# Patient Record
Sex: Male | Born: 1950
Health system: Southern US, Community
[De-identification: ages and names within clinical notes are randomized; demographics above are authoritative.]

## PROBLEM LIST (undated history)

## (undated) DIAGNOSIS — I10 Essential (primary) hypertension: Secondary | ICD-10-CM

## (undated) DIAGNOSIS — R972 Elevated prostate specific antigen [PSA]: Secondary | ICD-10-CM

## (undated) DIAGNOSIS — E119 Type 2 diabetes mellitus without complications: Secondary | ICD-10-CM

## (undated) DIAGNOSIS — M109 Gout, unspecified: Secondary | ICD-10-CM

## (undated) DIAGNOSIS — K219 Gastro-esophageal reflux disease without esophagitis: Secondary | ICD-10-CM

## (undated) DIAGNOSIS — R9431 Abnormal electrocardiogram [ECG] [EKG]: Secondary | ICD-10-CM

## (undated) DIAGNOSIS — Z87442 Personal history of urinary calculi: Secondary | ICD-10-CM

## (undated) DIAGNOSIS — C61 Malignant neoplasm of prostate: Secondary | ICD-10-CM

## (undated) DIAGNOSIS — N189 Chronic kidney disease, unspecified: Secondary | ICD-10-CM

## (undated) DIAGNOSIS — E7800 Pure hypercholesterolemia, unspecified: Secondary | ICD-10-CM

## (undated) DIAGNOSIS — H3530 Unspecified macular degeneration: Secondary | ICD-10-CM

## (undated) DIAGNOSIS — G4730 Sleep apnea, unspecified: Secondary | ICD-10-CM

## (undated) HISTORY — PX: KIDNEY STONE SURGERY: SHX686

## (undated) HISTORY — PX: PROSTATE BIOPSY: SHX241

## (undated) HISTORY — PX: NASAL SINUS SURGERY: SHX719

## (undated) HISTORY — DX: Chronic kidney disease, unspecified: N18.9

## (undated) HISTORY — DX: Pure hypercholesterolemia, unspecified: E78.00

## (undated) HISTORY — DX: Elevated prostate specific antigen (PSA): R97.20

## (undated) HISTORY — DX: Gout, unspecified: M10.9

## (undated) HISTORY — DX: Unspecified macular degeneration: H35.30

---

## 1998-05-19 ENCOUNTER — Emergency Department (HOSPITAL_COMMUNITY): Admission: EM | Admit: 1998-05-19 | Discharge: 1998-05-19 | Payer: Self-pay | Admitting: Emergency Medicine

## 1999-06-02 ENCOUNTER — Encounter: Payer: Self-pay | Admitting: Family Medicine

## 1999-06-02 ENCOUNTER — Encounter: Admission: RE | Admit: 1999-06-02 | Discharge: 1999-06-02 | Payer: Self-pay | Admitting: Family Medicine

## 2000-03-23 ENCOUNTER — Emergency Department (HOSPITAL_COMMUNITY): Admission: EM | Admit: 2000-03-23 | Discharge: 2000-03-23 | Payer: Self-pay | Admitting: Emergency Medicine

## 2000-03-24 ENCOUNTER — Encounter: Payer: Self-pay | Admitting: Emergency Medicine

## 2003-07-21 ENCOUNTER — Emergency Department (HOSPITAL_COMMUNITY): Admission: EM | Admit: 2003-07-21 | Discharge: 2003-07-21 | Payer: Self-pay | Admitting: Emergency Medicine

## 2004-11-06 ENCOUNTER — Emergency Department (HOSPITAL_COMMUNITY): Admission: EM | Admit: 2004-11-06 | Discharge: 2004-11-06 | Payer: Self-pay | Admitting: Emergency Medicine

## 2006-02-07 ENCOUNTER — Encounter (INDEPENDENT_AMBULATORY_CARE_PROVIDER_SITE_OTHER): Payer: Self-pay | Admitting: *Deleted

## 2006-02-07 ENCOUNTER — Ambulatory Visit (HOSPITAL_COMMUNITY): Admission: RE | Admit: 2006-02-07 | Discharge: 2006-02-07 | Payer: Self-pay | Admitting: *Deleted

## 2006-05-07 ENCOUNTER — Encounter (INDEPENDENT_AMBULATORY_CARE_PROVIDER_SITE_OTHER): Payer: Self-pay | Admitting: *Deleted

## 2006-05-07 ENCOUNTER — Inpatient Hospital Stay (HOSPITAL_COMMUNITY): Admission: RE | Admit: 2006-05-07 | Discharge: 2006-05-15 | Payer: Self-pay | Admitting: General Surgery

## 2006-05-07 HISTORY — PX: RIGHT COLECTOMY: SHX853

## 2006-08-12 ENCOUNTER — Emergency Department (HOSPITAL_COMMUNITY): Admission: EM | Admit: 2006-08-12 | Discharge: 2006-08-12 | Payer: Self-pay | Admitting: Emergency Medicine

## 2007-11-24 ENCOUNTER — Encounter: Admission: RE | Admit: 2007-11-24 | Discharge: 2007-12-08 | Payer: Self-pay | Admitting: Endocrinology

## 2007-12-11 ENCOUNTER — Encounter: Admission: RE | Admit: 2007-12-11 | Discharge: 2007-12-18 | Payer: Self-pay | Admitting: Endocrinology

## 2008-02-29 ENCOUNTER — Inpatient Hospital Stay (HOSPITAL_COMMUNITY): Admission: EM | Admit: 2008-02-29 | Discharge: 2008-03-02 | Payer: Self-pay | Admitting: Emergency Medicine

## 2008-03-01 ENCOUNTER — Encounter (INDEPENDENT_AMBULATORY_CARE_PROVIDER_SITE_OTHER): Payer: Self-pay | Admitting: Internal Medicine

## 2008-03-01 ENCOUNTER — Ambulatory Visit: Payer: Self-pay | Admitting: Vascular Surgery

## 2010-05-22 LAB — URINALYSIS, ROUTINE W REFLEX MICROSCOPIC
Bilirubin Urine: NEGATIVE
Hgb urine dipstick: NEGATIVE
Ketones, ur: NEGATIVE mg/dL
Leukocytes, UA: NEGATIVE
Nitrite: NEGATIVE
Protein, ur: 100 mg/dL — AB
Specific Gravity, Urine: 1.024 (ref 1.005–1.030)
pH: 7 (ref 5.0–8.0)

## 2010-05-22 LAB — FOLATE RBC: RBC Folate: 883 ng/mL — ABNORMAL HIGH (ref 180–600)

## 2010-05-22 LAB — GLUCOSE, CAPILLARY
Glucose-Capillary: 105 mg/dL — ABNORMAL HIGH (ref 70–99)
Glucose-Capillary: 121 mg/dL — ABNORMAL HIGH (ref 70–99)
Glucose-Capillary: 129 mg/dL — ABNORMAL HIGH (ref 70–99)
Glucose-Capillary: 163 mg/dL — ABNORMAL HIGH (ref 70–99)

## 2010-05-22 LAB — CBC
HCT: 40.2 % (ref 39.0–52.0)
HCT: 40.8 % (ref 39.0–52.0)
Hemoglobin: 13.4 g/dL (ref 13.0–17.0)
Hemoglobin: 13.4 g/dL (ref 13.0–17.0)
MCHC: 32.9 g/dL (ref 30.0–36.0)
MCV: 80.2 fL (ref 78.0–100.0)
MCV: 80.6 fL (ref 78.0–100.0)
RBC: 5.09 MIL/uL (ref 4.22–5.81)
RDW: 14.6 % (ref 11.5–15.5)
WBC: 11.1 10*3/uL — ABNORMAL HIGH (ref 4.0–10.5)

## 2010-05-22 LAB — DIFFERENTIAL
Eosinophils Absolute: 0.1 10*3/uL (ref 0.0–0.7)
Lymphs Abs: 2.1 10*3/uL (ref 0.7–4.0)
Monocytes Relative: 5 % (ref 3–12)
Neutro Abs: 8.5 10*3/uL — ABNORMAL HIGH (ref 1.7–7.7)
Neutrophils Relative %: 75 % (ref 43–77)

## 2010-05-22 LAB — URINE MICROSCOPIC-ADD ON

## 2010-05-22 LAB — RPR
RPR Ser Ql: NONREACTIVE
RPR Ser Ql: NONREACTIVE

## 2010-05-22 LAB — COMPREHENSIVE METABOLIC PANEL
BUN: 15 mg/dL (ref 6–23)
CO2: 27 mEq/L (ref 19–32)
Calcium: 9.4 mg/dL (ref 8.4–10.5)
Creatinine, Ser: 1.03 mg/dL (ref 0.4–1.5)
GFR calc non Af Amer: >60 mL/min (ref >60)
Glucose, Bld: 116 mg/dL — ABNORMAL HIGH (ref 70–99)
Sodium: 141 mEq/L (ref 135–145)
Total Protein: 7.4 g/dL (ref 6.0–8.3)

## 2010-05-22 LAB — TSH: TSH: 2.369 u[IU]/mL (ref 0.350–4.500)

## 2010-05-22 LAB — POCT CARDIAC MARKERS
CKMB, poc: <1 ng/mL — ABNORMAL LOW (ref 1.0–8.0)
Myoglobin, poc: 121 ng/mL (ref 12–200)

## 2010-05-22 LAB — BASIC METABOLIC PANEL
BUN: 14 mg/dL (ref 6–23)
Chloride: 106 mEq/L (ref 96–112)
Creatinine, Ser: 0.96 mg/dL (ref 0.4–1.5)
Glucose, Bld: 113 mg/dL — ABNORMAL HIGH (ref 70–99)
Potassium: 3.6 mEq/L (ref 3.5–5.1)

## 2010-05-22 LAB — PROTIME-INR
INR: 1.1 (ref 0.00–1.49)
Prothrombin Time: 14.1 seconds (ref 11.6–15.2)

## 2010-05-22 LAB — APTT: aPTT: 30 seconds (ref 24–37)

## 2010-05-22 LAB — LIPID PANEL
HDL: 22 mg/dL — ABNORMAL LOW (ref >39)
LDL Cholesterol: 107 mg/dL — ABNORMAL HIGH (ref 0–99)
Triglycerides: 176 mg/dL — ABNORMAL HIGH (ref <150)
VLDL: 35 mg/dL (ref 0–40)

## 2010-05-22 LAB — VITAMIN B12: Vitamin B-12: 250 pg/mL (ref 211–911)

## 2010-05-22 LAB — HIV ANTIBODY (ROUTINE TESTING W REFLEX): HIV: NONREACTIVE

## 2010-05-22 LAB — CARDIAC PANEL(CRET KIN+CKTOT+MB+TROPI)
Relative Index: 1 (ref 0.0–2.5)
Total CK: 212 U/L (ref 7–232)

## 2010-05-22 LAB — TROPONIN I: Troponin I: <0.01 ng/mL (ref 0.00–0.06)

## 2010-05-22 LAB — CK TOTAL AND CKMB (NOT AT ARMC): Total CK: 177 U/L (ref 7–232)

## 2010-06-20 NOTE — Discharge Summary (Signed)
NAME:  Joshua Roberts, BLANSETT NO.:  [id number]   MEDICAL RECORD NO.:  [id number]          PATIENT TYPE:  INP   LOCATION:  4707                         FACILITY:  [facility]   PHYSICIAN:  Richarda Overlie, MD       DATE OF BIRTH:  [birth year]   DATE OF ADMISSION:  02/29/2008  DATE OF DISCHARGE:  03/02/2008                               DISCHARGE SUMMARY   DISCHARGE DIAGNOSES:  Kindly see Dr. Vertis Kelch discharge summary from March 01, 2008 for  further details of the patient's current hospitalization.  The patient  did not have any event on telemetry monitoring.  He was found to have  right bundle branch block and a anterior fascicular block.  No  significant pauses on telemetry.  The patient did not demonstrate any  orthostasis.  Was ruled out for acute coronary syndrome.  His carotid  duplex ultrasound showed no significant internal carotid artery disease.  His cardiac echo showed ejection fraction of 50-65%, no left ventricular  wall motion abnormalities.  One significant finding on his lab work was  vitamin B12 was low normal at 250.  Low vitamin B12 could cause mild  subacute and degeneration of the dorsal and lateral spinal column  but  he does not demonstrate any obvious memory loss, irritability, or  dementia.  The patient's MRI and MRA was negative.  Normal appearance of  the brain, complete opacification of the left frontal and anterior  ethmoid sinuses could represent sinusitis or possible mucocele  formation.  The sinusitis appears to be probably chronic as the patient  does not have any symptoms of nasal discharge, facial pain, or pressure.  I doubt that this is a contributing factor to the patient's symptoms.  The patient has been counseled about use of Glucometer with close  fingerstick blood glucose monitoring to rule out hypoglycemic episodes  during his episodes of vertigo, as the patient is a diabetic, and he has  never used a Glucometer at home, which he needs to  especially during  these episodes .  The patient was counseled about glucose monitoring  after he was discharged.   DISCHARGE MEDICATIONS:  1. Aspirin 81 mg p.o. daily.  2. Scopolamine patch 1 patch q.72 hours.  3. Vitamin B12 1000 mcg weekly x1 month.  4. Allopurinol 300 mg p.o. daily.  5. Maxalt 5/40 mg p.o. daily.  6. Nexium 40 mg p.o. daily.  7. Bumex 1 g daily to be started on March 04, 2008.   FOLLOWUP:  The patient to follow up with [facility] at number  [phone] if he continues to have symptoms .  I have instructed him to  call the clinic tomorrow and set up this appointment .  Demographic  information and insurance information were faxed today.  The patient  also instructed to follow up with PCP, Dr. Juleen China, in 7 to 10 days.  [facility] has been contacted for home health and vestibular rehab program.      Richarda Overlie, MD  Electronically Signed     NA/MEDQ  D:  03/02/2008  T:  03/02/2008  Job:  [id number] 

## 2010-06-20 NOTE — Discharge Summary (Signed)
NAME:  CHANC, KERVIN NO.:  [id number]   MEDICAL RECORD NO.:  [id number]          PATIENT TYPE:  INP   LOCATION:  4707                         FACILITY:  [facility]   PHYSICIAN:  Lucita Ferrara, MD         DATE OF BIRTH:  05/03/1950   DATE OF ADMISSION:  02/29/2008  DATE OF DISCHARGE:  03/01/2008                               DISCHARGE SUMMARY   NOTE:  Likely date of discharge March 01, 2008 if the patient is able  to tolerate ambulation, and if all his test results will be back.   DISCHARGE DIAGNOSES:  1. Admitted with gait ataxia.  2. Presyncopal symptoms including dizziness.  3. Mild nausea.  4. Hypertension.  5. Uncontrolled diabetes.  6. Gastroesophageal reflux disease.  7. Sinusitis per magnetic resonance scan.  8. Hyperlipidemia.  9. History of gout.  10.Obstructive sleep apnea on CPAP.  11.Code - full code.   PROCEDURES:  The patient had an MRI, MRA of the brain.  1. MRA of the brain shows negative for acute or subacute infarct,      normal appearance of the brain without atrophy, complete      opacification of the last division of the frontal sinus possibly      with early mucocele.  Normal appearance of the brain.  2. MRA of brain - normal intracranial angiography.   PENDING TESTS:  2-D echocardiogram, HIV, RPR.   BRIEF HISTORY OF PRESENT ILLNESS:  Mr. Frasier is a 60 year old pleasant  gentleman who presented to the [facility] on February 29, 2008 with being unsteady on his feet.  He said the room was spinning  around him.  He has history of diabetes and obstructive sleep apnea and  is currently on CPAP.  His dizziness is sometimes accompanied by nausea.  His symptoms started when he awoke about 8:00 a.m. yesterday and got  progressively worse where he was completely unsteady on his feet so that  he called EMS and presented to the emergency room.  His  electrocardiogram showed a right bundle-branch with a with left anterior  fascicular block which was consistent with prior EKG.  His cardiac  enzymes remained negative.  He was not short of breath.  In fact,  currently he wants to ambulate and use the shower.   PENDING TESTS AND PENDING TREATMENT:  2-D echocardiogram is pending.  HIV test and RPR is pending, as is diabetic care coordinator and  diabetic teaching to optimize his diabetic care.   DISCHARGE MEDICATIONS:  Yet to be determined and will be addressed at  the time of discharge.  The patient will now be evaluated by physical  therapy and occupational therapy for his gait and possible vestibular  exercises.      Lucita Ferrara, MD  Electronically Signed     RR/MEDQ  D:  03/01/2008  T:  03/01/2008  Job:  [id number]

## 2010-06-20 NOTE — H&P (Signed)
NAME:  Joshua Roberts, Joshua Roberts NO.:  [id number]   MEDICAL RECORD NO.:  [id number]          PATIENT TYPE:  INP   LOCATION:  1860                         FACILITY:  [facility]   PHYSICIAN:  Acey Lav, MD  DATE OF BIRTH:  Sep 01, 1950   DATE OF ADMISSION:  02/29/2008  DATE OF DISCHARGE:                              HISTORY & PHYSICAL   PRIMARY CARE PHYSICIAN:  Brooke Bonito, M.D.   CHIEF COMPLAINT:  Unsteadiness on feet and feelings of wooziness.   HISTORY OF PRESENT ILLNESS:  Joshua Roberts is a 60 year old African  American gentleman with a past medical history significant for  hypertension, diabetes and obstructive sleep apnea (noncompliant with  CPAP) who has had for several months and weeks feelings of dizziness and  unsteadiness on his feet, occasionally accompanied by nausea.  These  symptoms occur every week and have occurred with increasing frequency  the last several days, occurring every day the last 3 days.  This  morning he awoke at 8 a.m., got up to walk around and felt dizzy,  lightheaded and felt like his feet were going to give out from under  him.  He went back to his bed and stayed there, symptoms resolved but in  an hour he called EMS and came to the emergency department at [facility].  In the ED, he was found to be in sinus rhythm.  Electrocardiogram showed right bundle branch block wide with a left  anterior fascicular block consistent with prior electrocardiogram.  His  initial cardiac enzymes were negative.  Noncontrasted CT scan of the  head showed no acute cranial findings or mass lesions, but some left  frontal and scattered ethmoid sinus changes.  At present, Joshua Roberts is  no longer feeling dizzy or lightheaded, he has no specific complaints at  this time.   PAST MEDICAL HISTORY:  1. Diabetes mellitus type 2.  2. Hypertension.  3. Obstructive sleep apnea.  4. History of numerous tubulovillous adenomas, status post resection      May 07, 2006.  5. History of gastroesophageal reflux disease.   PAST SURGICAL HISTORY:  As I mentioned above, partial colectomy.   REVIEW OF SYSTEMS:  Are mentioned above.  No fevers, no chills, no  cough, no dysuria, no hematuria, no weight loss, no weight gain, no  lymphadenopathy, no new rashes.   SOCIAL HISTORY:  He is divorced, not sexually active, does not smoke,  does not endorse any illicit drug use, used to drink alcohol but has not  drank for a year, denies having any alcohol problems.   FAMILY HISTORY:  Significant for members with colon cancer.   CURRENT MEDICATIONS:  1. Allopurinol 300 mg daily.  2. Azor 5/40 mg daily.  3. Nexium 40 mg daily.  4. Glumetza 1 gram daily.   ALLERGIES:  No known drug allergies.   PHYSICAL EXAMINATION:  Blood pressure is 130/72, pulse 79, respirations  18, T-max 97.6, pulse ox is 99% on room air.  GENERAL:  A quite pleasant gentleman in no acute distress.  HEENT:  He has got  a prominent neck which is quite thick, some slightly  cushingoid appearance.  His pupils are equal, round, reactive to light.  Sclerae anicteric.  Oropharynx clear without exudate or lesion.  Mallampati class 4 airway.  NECK:  Thick, no bruits heard.  CARDIOVASCULAR:  Regular rate and rhythm.  No murmurs, gallops or rubs.  LUNGS:  Distant breath sounds but clear to auscultation bilaterally  without wheezes, rhonchi or rales.  ABDOMEN:  Soft, nondistended, nontender.  Positive bowel sounds.  EXTREMITIES:  With 1+ pretibial edema.  NEUROLOGICAL EXAM:  Cranial nerves II-XII are intact.  Cerebellar  function intact by finger-to-nose and heel-to-shin.  Sensation intact  throughout, upper and lower extremity strength 5/5 bilaterally.  Biceps  and DTRs were 1+ bilaterally, Babinskis downgoing.   LABORATORY DATA:  CT scan as described above.  Chest x-ray two-view:  Mild bronchitic changes per radiology.  Electrocardiogram showed sinus  rhythm with right bundle branch block  and left anterior fascicular  block.  There were no changes compared to his electrocardiogram August 12, 2006.   UA:  Zero to 2 white cells, no bacteria.  Comprehensive metabolic panel:  Is notable for a glucose of 116, creatinine 1.03.  Liver function tests  were entirely normal.  Cardiac markers were negative.  CBC differential:  White count 11.3, hemoglobin 13.4, platelets 311, and ANC 8.5.   ASSESSMENT AND PLAN:  This is a 60 year old after gentleman with  obstructive sleep apnea, hypertension, diabetes mellitus with symptoms  of wooziness, unsteadiness on his feet.  1. Unsteadiness on feet with some presyncopal symptoms dizziness,      lightheadedness with some nausea.  We will admit the patient and      initiate a syncope workup, monitor the patient on telemetry,      cycling cardiac enzymes.  We will check an MRI and MRA of the brain      in the morning.  We will check a 2-D echocardiogram, although I      doubt an obstructive cause for syncope.  It would still be useful,      given his obstructive sleep apnea, to check a 2-D echocardiogram.      Will check an electrocardiogram in the morning.  Certainly, another      possibility would be that he has had a hypoglycemic episode.  He      has not checked his blood sugars during these episodes, so a low      blood sugar certainly could be in the differential for his      symptomatology.  2. Hypertension.  I will continue him on his home dose of Azor, I will      give him aspirin.  3. Diabetes.  I will hold his Glumetza in case he needs to get a      contrasted study.  I will put him on sliding-scale insulin.  4. Gout.  I will continue him on his allopurinol.  5. Prophylaxis.  I will put him on heparin 5000 three times daily.  6. Health screening: I will also check him for screening for syphilis      and HIV with RPR and HIV antibody.  7. Gastroesophageal reflux disease.  I will continue him on Nexium.  8. Code status.  The patient  is a Full Code.     Acey Lav, MD  Electronically Signed    CV/MEDQ  D:  02/29/2008  T:  02/29/2008  Job:  [id number]

## 2010-06-23 NOTE — Op Note (Signed)
NAME:  Joshua Roberts, Joshua Roberts NO.:  [id number]   MEDICAL RECORD NO.:  [id number]          PATIENT TYPE:  INP   LOCATION:  0001                         FACILITY:  [facility]   PHYSICIAN:  Anselm Pancoast. Weatherly, M.D.DATE OF BIRTH:  1951/01/30   DATE OF PROCEDURE:  05/07/2006  DATE OF DISCHARGE:                               OPERATIVE REPORT   PREOPERATIVE DIAGNOSES:  Tubovillous adenoma, hepatic flexure area.   OPERATION:  Right colectomy.   ANESTHESIA:  General anesthesia.   SURGEON:  Anselm Pancoast. Zachery Dakins, M.D.   ASSISTANT:  Leonie Man, M.D.   INDICATIONS FOR PROCEDURE:  Mr. Joshua Roberts is a 60 year old  overweight black male, who was referred to me by Dr. Lacretia Nicks. Candelaria Stagers and Dr.  Georgiana Spinner, after the patient had undergone a colonoscopy because of  guaiac-positive stools.  He was found to have several polyps scattered  through the colon.  The larger one, Dr. Virginia Rochester described, as probably in  the splenic flexure area, but he placed a clip on it and post-  colonoscopy had the KUB that showed that the area was closer to the  hepatic flexure.  There was a real redundancy of the transverse colon  and Dr. Virginia Rochester described that the patient was moving about during the  colonoscopy.  The patient is here for the planned procedure.  He is  approximately 266 pounds and only about 5 feet 6 inches in height.   CHRONIC MEDICATIONS:  Blood pressure medicines.   SOCIAL HISTORY:  He does not smoke.   LABORATORY DATA:  Preoperatively his liver function tests and  electrolytes were all normal.   DESCRIPTION OF PROCEDURE:  He was given 3 gm of Unasyn, his PAS  stockings, and taken to the operative suite.  He is about as wide as he  is high, and I think a right transverse incision would be the most  comfortable postoperatively.  We had discussed that with the patient.  The patient's abdomen was first clipped and then prepped with Betadine  surgical scrub and solution.  Then we made a  transverse incision about  halfway between the umbilicus and the sub-midline and the underlying  approximately three inches of adipose tissue was divided.  The bleeders  were coagulated and clamped.  Then the anterior rectus fascia was  divided with cautery.  We elevated the rectus muscle over a Kelly and  then divided it with cautery and then carefully opened into the  peritoneal cavity.  The liver looked grossly normal.  In the  gallbladder, I could not feel any stones.  He has a very abundant  omentum.  We mobilized the hepatic flexure area first and then elected  to go ahead and remove the omentum, so we could actually feel the  hepatic flexure better, to see if we could actually feel the lesion.  I  thought possibly about doing a little segmental resection, but since we  were not able to actually feel the area, elected to go ahead and  mobilize the lateral peritoneal reflection, so we could get the cecum up  in the  wound.  We had placed a [place] and were using the extenders on  the [place].  The incision went to the lateral edge of the rectus and  just to the midline or possibly 1 cm over.  Then after the hepatic  flexure and lateral had been mobilized, we switched to a [place] so we  could rotate the areas into the wound.  We then continued taking the  omentum off of the transverse colon and I think we really were still in  the right colon, but definitely a lot closer to the midline.  We then  divided the mesentery between [facility], which were doubly ligated with #2-  0 silk on the areas of any vessels of any size.  Then brought up the two  ends, so we got the terminal ileum, removing about six inches of it, and  the transverse colon, and then we used the GIA-75 and the TA-60 to do  the triangulation double-fire stapler anastomosis.  The mesenteric  defect was then closed with #3-0 Vicryl and then I went and actually  opened the specimen on the back table.  We were only about 1 cm  or so  past with the way we had actually pulled, but there was obviously no  evidence of any lesions when we had opened to do the TA-60 firing, and  there had been no bleeding.  I sent the specimen for the pathologist.  Dr. Karin Golden looked at it and said she thought it certainly looked benign,  as the pathology report was that even though it is not a 3 cm or 4 cm  margin, I think it is adequate, and I would have to get past the middle  colic vessels, if we extend it and removed any more.  The small bowel  was back in anatomic position.  It was a little lateral area of the  omentum that was a little dusky that we removed between [facility] and these  were tied with #2-0 silk.  Then we closed the two layers of the  abdominal incision with running #1 Novofil and some interrupted sutures  right at the midline of #1 Novofil.  The subcutaneous tissue was  irrigated with saline and then the skin was closed with staples.   The patient tolerated the procedure nicely and was sent to the recovery  room in a stable postoperative condition.           ______________________________  Anselm Pancoast. Zachery Dakins, M.D.     WJW/MEDQ  D:  05/07/2006  T:  05/07/2006  Job:  [id number]   cc:   Georgiana Spinner, M.D.  Fax: [phone]   Brooke Bonito, M.D.  Fax: [phone]

## 2010-11-21 LAB — I-STAT 8, (EC8 V) (CONVERTED LAB)
Acid-Base Excess: 2
Glucose, Bld: 123 — ABNORMAL HIGH
TCO2: 28
pCO2, Ven: 40.5 — ABNORMAL LOW
pH, Ven: 7.423 — ABNORMAL HIGH

## 2010-11-21 LAB — POCT CARDIAC MARKERS
CKMB, poc: 1.4
Myoglobin, poc: 120
Troponin i, poc: <0.05

## 2010-11-21 LAB — POCT I-STAT CREATININE
Creatinine, Ser: 1.3
Operator id: 288331

## 2011-10-31 ENCOUNTER — Encounter (INDEPENDENT_AMBULATORY_CARE_PROVIDER_SITE_OTHER): Payer: Federal, State, Local not specified - PPO | Admitting: Ophthalmology

## 2011-10-31 DIAGNOSIS — I10 Essential (primary) hypertension: Secondary | ICD-10-CM

## 2011-10-31 DIAGNOSIS — E11319 Type 2 diabetes mellitus with unspecified diabetic retinopathy without macular edema: Secondary | ICD-10-CM

## 2011-10-31 DIAGNOSIS — H43819 Vitreous degeneration, unspecified eye: Secondary | ICD-10-CM

## 2011-10-31 DIAGNOSIS — H35039 Hypertensive retinopathy, unspecified eye: Secondary | ICD-10-CM

## 2011-10-31 DIAGNOSIS — H33309 Unspecified retinal break, unspecified eye: Secondary | ICD-10-CM

## 2011-10-31 DIAGNOSIS — E1139 Type 2 diabetes mellitus with other diabetic ophthalmic complication: Secondary | ICD-10-CM

## 2011-10-31 DIAGNOSIS — H348392 Tributary (branch) retinal vein occlusion, unspecified eye, stable: Secondary | ICD-10-CM

## 2011-11-07 ENCOUNTER — Encounter (INDEPENDENT_AMBULATORY_CARE_PROVIDER_SITE_OTHER): Payer: Federal, State, Local not specified - PPO | Admitting: Ophthalmology

## 2011-11-07 DIAGNOSIS — H33309 Unspecified retinal break, unspecified eye: Secondary | ICD-10-CM

## 2011-11-14 ENCOUNTER — Ambulatory Visit (INDEPENDENT_AMBULATORY_CARE_PROVIDER_SITE_OTHER): Payer: Federal, State, Local not specified - PPO | Admitting: Ophthalmology

## 2011-11-14 DIAGNOSIS — H348392 Tributary (branch) retinal vein occlusion, unspecified eye, stable: Secondary | ICD-10-CM

## 2011-11-14 DIAGNOSIS — H33309 Unspecified retinal break, unspecified eye: Secondary | ICD-10-CM

## 2012-02-14 ENCOUNTER — Encounter (INDEPENDENT_AMBULATORY_CARE_PROVIDER_SITE_OTHER): Payer: Federal, State, Local not specified - PPO | Admitting: Ophthalmology

## 2012-02-14 DIAGNOSIS — H2510 Age-related nuclear cataract, unspecified eye: Secondary | ICD-10-CM

## 2012-02-14 DIAGNOSIS — H43819 Vitreous degeneration, unspecified eye: Secondary | ICD-10-CM

## 2012-02-14 DIAGNOSIS — E11319 Type 2 diabetes mellitus with unspecified diabetic retinopathy without macular edema: Secondary | ICD-10-CM

## 2012-02-14 DIAGNOSIS — H35039 Hypertensive retinopathy, unspecified eye: Secondary | ICD-10-CM

## 2012-02-14 DIAGNOSIS — I10 Essential (primary) hypertension: Secondary | ICD-10-CM

## 2012-02-14 DIAGNOSIS — E1139 Type 2 diabetes mellitus with other diabetic ophthalmic complication: Secondary | ICD-10-CM

## 2012-02-14 DIAGNOSIS — E1165 Type 2 diabetes mellitus with hyperglycemia: Secondary | ICD-10-CM

## 2012-02-14 DIAGNOSIS — H33309 Unspecified retinal break, unspecified eye: Secondary | ICD-10-CM

## 2012-02-14 DIAGNOSIS — H348392 Tributary (branch) retinal vein occlusion, unspecified eye, stable: Secondary | ICD-10-CM

## 2012-07-09 ENCOUNTER — Encounter: Payer: Self-pay | Admitting: Physician Assistant

## 2012-07-09 ENCOUNTER — Ambulatory Visit (INDEPENDENT_AMBULATORY_CARE_PROVIDER_SITE_OTHER): Payer: Federal, State, Local not specified - PPO | Admitting: Physician Assistant

## 2012-07-09 VITALS — BP 136/90 | HR 69 | Ht 67.0 in | Wt 263.3 lb

## 2012-07-09 DIAGNOSIS — E119 Type 2 diabetes mellitus without complications: Secondary | ICD-10-CM

## 2012-07-09 DIAGNOSIS — E669 Obesity, unspecified: Secondary | ICD-10-CM

## 2012-07-09 DIAGNOSIS — I10 Essential (primary) hypertension: Secondary | ICD-10-CM | POA: Insufficient documentation

## 2012-07-09 DIAGNOSIS — I4510 Unspecified right bundle-branch block: Secondary | ICD-10-CM

## 2012-07-09 DIAGNOSIS — G4733 Obstructive sleep apnea (adult) (pediatric): Secondary | ICD-10-CM | POA: Insufficient documentation

## 2012-07-09 DIAGNOSIS — E1169 Type 2 diabetes mellitus with other specified complication: Secondary | ICD-10-CM | POA: Insufficient documentation

## 2012-07-09 DIAGNOSIS — E785 Hyperlipidemia, unspecified: Secondary | ICD-10-CM

## 2012-07-09 NOTE — Patient Instructions (Addendum)
Follow up in 6 Months with Dr. Allyson Sabal.  Our Nutritionist will call you to set up an appt.

## 2012-07-09 NOTE — Assessment & Plan Note (Signed)
Blood pressure is a little bit above target particularly in the diastolic number. He cuts down his sodium intake as we have discussed and make some changes in the next 3 months to help with nutrition consult will probably see some improvement

## 2012-07-09 NOTE — Progress Notes (Signed)
Date:  07/09/2012   ID:  Joshua Roberts, DOB [birth year], MRN [id number]  PCP:  Michiel Sites, MD  Primary Cardiologist:  Allyson Sabal   History of Present Illness: Joshua Roberts is a 62 y.o. male who is obese and has a history of hypertension, hyperlipidemia, non-insulin requiring diabetes, obstructive sleep apnea and is currently not using the CPAP because of the for one yet. His mother died of a myocardial infarction at age 68 and a brother with MI at age 75. Patient had nuclear stress test June of last year and was nonischemic with ejection fraction of 63%. Patient presents for six-month followup appointment he reports no complaints at this time. No nausea, vomiting, chest pain, shortness of breath, orthopnea, PND, chest pain, dizziness, palpitations, abdominal pain, lower extremity edema, hematochezia, melena, hematuria. He does report that for one reason or another he has not been set up with CPAP however it was supposedly done on the previous office visit. He does report she has not been taken his toes are that he needs to restart that.   Wt Readings from Last 3 Encounters:  07/09/12 263 lb 4.8 oz (119.432 kg)     History reviewed. No pertinent past medical history.  Current Outpatient Prescriptions  Medication Sig Dispense Refill  . allopurinol (ZYLOPRIM) 300 MG tablet Take 300 mg by mouth daily.      Marland Kitchen amLODipine-olmesartan (AZOR) 5-40 MG per tablet Take 1 tablet by mouth daily.      Marland Kitchen aspirin 81 MG tablet Take 81 mg by mouth daily.      . benzonatate (TESSALON) 200 MG capsule Take 200 mg by mouth 3 (three) times daily as needed for cough.      . esomeprazole (NEXIUM) 40 MG capsule Take 40 mg by mouth daily before breakfast.      . LORazepam (ATIVAN) 0.5 MG tablet Take 0.5 mg by mouth every 8 (eight) hours as needed for anxiety.      . metFORMIN (GLUMETZA) 1000 MG (MOD) 24 hr tablet Take 1,000 mg by mouth 2 (two) times daily with a meal.      . nebivolol (BYSTOLIC) 10 MG tablet Take  20 mg by mouth daily.      . pravastatin (PRAVACHOL) 80 MG tablet Take 80 mg by mouth daily.      . Liraglutide (VICTOZA [place]) Inject 0.6 mg into the skin every morning.       No current facility-administered medications for this visit.    Allergies:   No Known Allergies  Social History:  The patient  reports that he has never smoked. He does not have any smokeless tobacco history on file. He reports that he drinks about 1.0 ounces of alcohol per week.   ROS:  Please see the history of present illness.  All other systems reviewed and negative.   PHYSICAL EXAM: VS:  BP 136/90  Pulse 69  Ht 5\' 7"  (1.702 m)  Wt 263 lb 4.8 oz (119.432 kg)  BMI 41.23 kg/m2 Obese, well developed, in no acute distress HEENT: Pupils are equal round react to light accommodation extraocular movements are intact.  Neck: no JVDNo cervical lymphadenopathy. Cardiac: Regular rate and rhythm without murmurs rubs or gallops. Lungs:  clear to auscultation bilaterally, no wheezing, rhonchi or rales Abd: soft, obese , nontender, positive bowel sounds all quadrants, no hepatosplenomegaly Ext: Trace lower extremity edema.  2+ radial and dorsalis pedis pulses. Skin: warm and dry Neuro:  Grossly normal  EKG:  Rate 69 beats per  minute right ventricular hypertrophy with repolarization abnormality and left anterior fascicular block. Essentially unchanged from prior EKG    ASSESSMENT AND PLAN:  Problem List Items Addressed This Visit   Obstructive sleep apnea:      Patient underwent sleep study July 2013 for one reason or another he was never fitted for CPAP. We have contacted the sleep Center to make arrangements for this to happen.    Obesity, Class III, BMI 40-49.9 (morbid obesity)     I have discussed lifestyle changes in the form of a diet and exercise. We discussed adding 30-60 minutes of cardiovascular exercise daily, but to slowly increase it over time until he gets to those levels. I will also arrange nutrition  consult.    Relevant Medications      metFORMIN (GLUMETZA) 1000 MG (MOD) 24 hr tablet      Liraglutide (VICTOZA [place])   Essential hypertension     Blood pressure is a little bit above target particularly in the diastolic number. He cuts down his sodium intake as we have discussed and make some changes in the next 3 months to help with nutrition consult will probably see some improvement    Relevant Medications      amLODipine-olmesartan (AZOR) 5-40 MG per tablet      aspirin 81 MG tablet      pravastatin (PRAVACHOL) 80 MG tablet      nebivolol (BYSTOLIC) 10 MG tablet   Hyperlipidemia     He is currently taking pravastatin. He just had labs drawn by Dr. Juleen China today.  Will try and obtain those results.    Relevant Medications      amLODipine-olmesartan (AZOR) 5-40 MG per tablet      aspirin 81 MG tablet      pravastatin (PRAVACHOL) 80 MG tablet      nebivolol (BYSTOLIC) 10 MG tablet   Diabetes mellitus type 2 in obese   Relevant Medications      amLODipine-olmesartan (AZOR) 5-40 MG per tablet      aspirin 81 MG tablet      pravastatin (PRAVACHOL) 80 MG tablet      metFORMIN (GLUMETZA) 1000 MG (MOD) 24 hr tablet      Liraglutide (VICTOZA [place])    Other Visit Diagnoses   HLD (hyperlipidemia)    -  Primary    Relevant Medications       amLODipine-olmesartan (AZOR) 5-40 MG per tablet       aspirin 81 MG tablet       pravastatin (PRAVACHOL) 80 MG tablet       nebivolol (BYSTOLIC) 10 MG tablet    Other Relevant Orders       EKG 12-Lead

## 2012-07-09 NOTE — Assessment & Plan Note (Signed)
He is currently taking pravastatin. He just had labs drawn by Dr. Juleen China today.  Will try and obtain those results.

## 2012-07-09 NOTE — Assessment & Plan Note (Signed)
I have discussed lifestyle changes in the form of a diet and exercise. We discussed adding 30-60 minutes of cardiovascular exercise daily, but to slowly increase it over time until he gets to those levels. I will also arrange nutrition consult.

## 2012-07-09 NOTE — Assessment & Plan Note (Signed)
Patient underwent sleep study July 2013 for one reason or another he was never fitted for CPAP. We have contacted the sleep Center to make arrangements for this to happen.

## 2012-07-17 ENCOUNTER — Encounter: Payer: Self-pay | Admitting: Cardiovascular Disease

## 2012-11-13 ENCOUNTER — Ambulatory Visit (INDEPENDENT_AMBULATORY_CARE_PROVIDER_SITE_OTHER): Payer: Federal, State, Local not specified - PPO | Admitting: Ophthalmology

## 2012-11-13 DIAGNOSIS — H43819 Vitreous degeneration, unspecified eye: Secondary | ICD-10-CM

## 2012-11-13 DIAGNOSIS — H2510 Age-related nuclear cataract, unspecified eye: Secondary | ICD-10-CM

## 2012-11-13 DIAGNOSIS — E1139 Type 2 diabetes mellitus with other diabetic ophthalmic complication: Secondary | ICD-10-CM

## 2012-11-13 DIAGNOSIS — H348392 Tributary (branch) retinal vein occlusion, unspecified eye, stable: Secondary | ICD-10-CM

## 2012-11-13 DIAGNOSIS — H35039 Hypertensive retinopathy, unspecified eye: Secondary | ICD-10-CM

## 2012-11-13 DIAGNOSIS — E11319 Type 2 diabetes mellitus with unspecified diabetic retinopathy without macular edema: Secondary | ICD-10-CM

## 2012-11-13 DIAGNOSIS — H33309 Unspecified retinal break, unspecified eye: Secondary | ICD-10-CM

## 2012-11-13 DIAGNOSIS — I10 Essential (primary) hypertension: Secondary | ICD-10-CM

## 2013-02-05 DIAGNOSIS — K922 Gastrointestinal hemorrhage, unspecified: Secondary | ICD-10-CM

## 2013-02-05 HISTORY — DX: Gastrointestinal hemorrhage, unspecified: K92.2

## 2013-02-18 ENCOUNTER — Encounter (HOSPITAL_COMMUNITY): Payer: Self-pay | Admitting: Emergency Medicine

## 2013-02-18 ENCOUNTER — Emergency Department (HOSPITAL_COMMUNITY): Payer: Federal, State, Local not specified - PPO

## 2013-02-18 ENCOUNTER — Emergency Department (HOSPITAL_COMMUNITY)
Admission: EM | Admit: 2013-02-18 | Discharge: 2013-02-18 | Disposition: A | Discharge status: Home / Self Care | Payer: Federal, State, Local not specified - PPO | Attending: Emergency Medicine | Admitting: Emergency Medicine

## 2013-02-18 DIAGNOSIS — IMO0002 Reserved for concepts with insufficient information to code with codable children: Secondary | ICD-10-CM | POA: Insufficient documentation

## 2013-02-18 DIAGNOSIS — R110 Nausea: Secondary | ICD-10-CM | POA: Insufficient documentation

## 2013-02-18 DIAGNOSIS — Y9301 Activity, walking, marching and hiking: Secondary | ICD-10-CM | POA: Insufficient documentation

## 2013-02-18 DIAGNOSIS — Y92009 Unspecified place in unspecified non-institutional (private) residence as the place of occurrence of the external cause: Secondary | ICD-10-CM | POA: Insufficient documentation

## 2013-02-18 DIAGNOSIS — K219 Gastro-esophageal reflux disease without esophagitis: Secondary | ICD-10-CM | POA: Insufficient documentation

## 2013-02-18 DIAGNOSIS — I10 Essential (primary) hypertension: Secondary | ICD-10-CM | POA: Insufficient documentation

## 2013-02-18 DIAGNOSIS — Z79899 Other long term (current) drug therapy: Secondary | ICD-10-CM | POA: Insufficient documentation

## 2013-02-18 DIAGNOSIS — E119 Type 2 diabetes mellitus without complications: Secondary | ICD-10-CM | POA: Insufficient documentation

## 2013-02-18 DIAGNOSIS — M549 Dorsalgia, unspecified: Secondary | ICD-10-CM

## 2013-02-18 DIAGNOSIS — W108XXA Fall (on) (from) other stairs and steps, initial encounter: Secondary | ICD-10-CM | POA: Insufficient documentation

## 2013-02-18 DIAGNOSIS — W19XXXA Unspecified fall, initial encounter: Secondary | ICD-10-CM

## 2013-02-18 DIAGNOSIS — Z7982 Long term (current) use of aspirin: Secondary | ICD-10-CM | POA: Insufficient documentation

## 2013-02-18 HISTORY — DX: Type 2 diabetes mellitus without complications: E11.9

## 2013-02-18 HISTORY — DX: Essential (primary) hypertension: I10

## 2013-02-18 HISTORY — DX: Gastro-esophageal reflux disease without esophagitis: K21.9

## 2013-02-18 LAB — URINE MICROSCOPIC-ADD ON

## 2013-02-18 LAB — BASIC METABOLIC PANEL
BUN: 16 mg/dL (ref 6–23)
CALCIUM: 9.2 mg/dL (ref 8.4–10.5)
CO2: 26 meq/L (ref 19–32)
Chloride: 101 mEq/L (ref 96–112)
Creatinine, Ser: 1.14 mg/dL (ref 0.50–1.35)
GFR calc Af Amer: 79 mL/min — ABNORMAL LOW (ref >90)
GFR calc non Af Amer: 68 mL/min — ABNORMAL LOW (ref >90)
Glucose, Bld: 190 mg/dL — ABNORMAL HIGH (ref 70–99)
POTASSIUM: 4.4 meq/L (ref 3.7–5.3)
SODIUM: 140 meq/L (ref 137–147)

## 2013-02-18 LAB — CBC
HCT: 38.7 % — ABNORMAL LOW (ref 39.0–52.0)
Hemoglobin: 13.2 g/dL (ref 13.0–17.0)
MCH: 26.7 pg (ref 26.0–34.0)
MCHC: 34.1 g/dL (ref 30.0–36.0)
MCV: 78.2 fL (ref 78.0–100.0)
PLATELETS: 307 10*3/uL (ref 150–400)
RBC: 4.95 MIL/uL (ref 4.22–5.81)
RDW: 14.4 % (ref 11.5–15.5)
WBC: 12.5 10*3/uL — AB (ref 4.0–10.5)

## 2013-02-18 LAB — URINALYSIS, ROUTINE W REFLEX MICROSCOPIC
GLUCOSE, UA: NEGATIVE mg/dL
Hgb urine dipstick: NEGATIVE
KETONES UR: NEGATIVE mg/dL
LEUKOCYTES UA: NEGATIVE
Nitrite: NEGATIVE
Protein, ur: 30 mg/dL — AB
Specific Gravity, Urine: 1.024 (ref 1.005–1.030)
Urobilinogen, UA: 0.2 mg/dL (ref 0.0–1.0)
pH: 5 (ref 5.0–8.0)

## 2013-02-18 MED ORDER — METHOCARBAMOL 500 MG PO TABS
1000.0000 mg | ORAL_TABLET | Freq: Once | ORAL | Status: AC
  Administered 2013-02-18: 1000 mg via ORAL
  Filled 2013-02-18: qty 2

## 2013-02-18 MED ORDER — METHOCARBAMOL 100 MG/ML IJ SOLN
1000.0000 mg | Freq: Once | INTRAMUSCULAR | Status: DC
  Filled 2013-02-18: qty 10

## 2013-02-18 MED ORDER — ONDANSETRON HCL 4 MG/2ML IJ SOLN
4.0000 mg | Freq: Once | INTRAMUSCULAR | Status: AC
  Administered 2013-02-18: 4 mg via INTRAVENOUS
  Filled 2013-02-18: qty 2

## 2013-02-18 MED ORDER — OXYCODONE-ACETAMINOPHEN 5-325 MG PO TABS: 1.0000 | ORAL_TABLET | Freq: Four times a day (QID) | ORAL | Status: DC | PRN

## 2013-02-18 MED ORDER — METHOCARBAMOL 500 MG PO TABS: 500.0000 mg | ORAL_TABLET | Freq: Two times a day (BID) | ORAL | Status: DC

## 2013-02-18 MED ORDER — MORPHINE SULFATE 4 MG/ML IJ SOLN
4.0000 mg | Freq: Once | INTRAMUSCULAR | Status: AC
  Administered 2013-02-18: 4 mg via INTRAVENOUS
  Filled 2013-02-18: qty 1

## 2013-02-18 NOTE — ED Notes (Signed)
MD at bedside. 

## 2013-02-18 NOTE — ED Notes (Signed)
Removed from [place]. No c-collar in place

## 2013-02-18 NOTE — Discharge Instructions (Signed)
Back Pain, Adult Low back pain is very common. About 1 in 5 people have back pain.The cause of low back pain is rarely dangerous. The pain often gets better over time.About half of people with a sudden onset of back pain feel better in just 2 weeks. About 8 in 10 people feel better by 6 weeks.  CAUSES Some common causes of back pain include:  Strain of the muscles or ligaments supporting the spine.  Wear and tear (degeneration) of the spinal discs.  Arthritis.  Direct injury to the back. DIAGNOSIS Most of the time, the direct cause of low back pain is not known.However, back pain can be treated effectively even when the exact cause of the pain is unknown.Answering your caregiver's questions about your overall health and symptoms is one of the most accurate ways to make sure the cause of your pain is not dangerous. If your caregiver needs more information, he or she may order lab work or imaging tests (X-rays or MRIs).However, even if imaging tests show changes in your back, this usually does not require surgery. HOME CARE INSTRUCTIONS For many people, back pain returns.Since low back pain is rarely dangerous, it is often a condition that people can learn to [facility] their own.   Remain active. It is stressful on the back to sit or stand in one place. Do not sit, drive, or stand in one place for more than 30 minutes at a time. Take short walks on level surfaces as soon as pain allows.Try to increase the length of time you walk each day.  Do not stay in bed.Resting more than 1 or 2 days can delay your recovery.  Do not avoid exercise or work.Your body is made to move.It is not dangerous to be active, even though your back may hurt.Your back will likely heal faster if you return to being active before your pain is gone.  Pay attention to your body when you bend and lift. Many people have less discomfortwhen lifting if they bend their knees, keep the load close to their bodies,and  avoid twisting. Often, the most comfortable positions are those that put less stress on your recovering back.  Find a comfortable position to sleep. Use a firm mattress and lie on your side with your knees slightly bent. If you lie on your back, put a pillow under your knees.  Only take over-the-counter or prescription medicines as directed by your caregiver. Over-the-counter medicines to reduce pain and inflammation are often the most helpful.Your caregiver may prescribe muscle relaxant drugs.These medicines help dull your pain so you can more quickly return to your normal activities and healthy exercise.  Put ice on the injured area.  Put ice in a plastic bag.  Place a towel between your skin and the bag.  Leave the ice on for 15-20 minutes, 03-04 times a day for the first 2 to 3 days. After that, ice and heat may be alternated to reduce pain and spasms.  Ask your caregiver about trying back exercises and gentle massage. This may be of some benefit.  Avoid feeling anxious or stressed.Stress increases muscle tension and can worsen back pain.It is important to recognize when you are anxious or stressed and learn ways to manage it.Exercise is a great option. SEEK MEDICAL CARE IF:  You have pain that is not relieved with rest or medicine.  You have pain that does not improve in 1 week.  You have new symptoms.  You are generally not feeling well. SEEK  IMMEDIATE MEDICAL CARE IF:   You have pain that radiates from your back into your legs.  You develop new bowel or bladder control problems.  You have unusual weakness or numbness in your arms or legs.  You develop nausea or vomiting.  You develop abdominal pain.  You feel faint. Document Released: 01/22/2005 Document Revised: 07/24/2011 Document Reviewed: 06/12/2010 [facility] Patient Information 2014 [place], [place].   Fall Prevention and Home Safety Falls cause injuries and can affect all age groups. It is possible to use  preventive measures to significantly decrease the likelihood of falls. There are many simple measures which can make your home safer and prevent falls. OUTDOORS  Repair cracks and edges of walkways and driveways.  Remove high doorway thresholds.  Trim shrubbery on the main path into your home.  Have good outside lighting.  Clear walkways of tools, rocks, debris, and clutter.  Check that handrails are not broken and are securely fastened. Both sides of steps should have handrails.  Have leaves, snow, and ice cleared regularly.  Use sand or salt on walkways during winter months.  In the garage, clean up grease or oil spills. BATHROOM  Install night lights.  Install grab bars by the toilet and in the tub and shower.  Use non-skid mats or decals in the tub or shower.  Place a plastic non-slip stool in the shower to sit on, if needed.  Keep floors dry and clean up all water on the floor immediately.  Remove soap buildup in the tub or shower on a regular basis.  Secure bath mats with non-slip, double-sided rug tape.  Remove throw rugs and tripping hazards from the floors. BEDROOMS  Install night lights.  Make sure a bedside light is easy to reach.  Do not use oversized bedding.  Keep a telephone by your bedside.  Have a firm chair with side arms to use for getting dressed.  Remove throw rugs and tripping hazards from the floor. KITCHEN  Keep handles on pots and pans turned toward the center of the stove. Use back burners when possible.  Clean up spills quickly and allow time for drying.  Avoid walking on wet floors.  Avoid hot utensils and knives.  Position shelves so they are not too high or low.  Place commonly used objects within easy reach.  If necessary, use a sturdy step stool with a grab bar when reaching.  Keep electrical cables out of the way.  Do not use floor polish or wax that makes floors slippery. If you must use wax, use non-skid floor  wax.  Remove throw rugs and tripping hazards from the floor. STAIRWAYS  Never leave objects on stairs.  Place handrails on both sides of stairways and use them. Fix any loose handrails. Make sure handrails on both sides of the stairways are as long as the stairs.  Check carpeting to make sure it is firmly attached along stairs. Make repairs to worn or loose carpet promptly.  Avoid placing throw rugs at the top or bottom of stairways, or properly secure the rug with carpet tape to prevent slippage. Get rid of throw rugs, if possible.  Have an electrician put in a light switch at the top and bottom of the stairs. OTHER FALL PREVENTION TIPS  Wear low-heel or rubber-soled shoes that are supportive and fit well. Wear closed toe shoes.  When using a stepladder, make sure it is fully opened and both spreaders are firmly locked. Do not climb a closed stepladder.  Add color or contrast paint or tape to grab bars and handrails in your home. Place contrasting color strips on first and last steps.  Learn and use mobility aids as needed. Install an electrical emergency response system.  Turn on lights to avoid dark areas. Replace light bulbs that burn out immediately. Get light switches that glow.  Arrange furniture to create clear pathways. Keep furniture in the same place.  Firmly attach carpet with non-skid or double-sided tape.  Eliminate uneven floor surfaces.  Select a carpet pattern that does not visually hide the edge of steps.  Be aware of all pets. OTHER HOME SAFETY TIPS  Set the water temperature for 120 F (48.8 C).  Keep emergency numbers on or near the telephone.  Keep smoke detectors on every level of the home and near sleeping areas. Document Released: 01/12/2002 Document Revised: 07/24/2011 Document Reviewed: 04/13/2011 [facility] Patient Information 2014 [place].

## 2013-02-18 NOTE — ED Notes (Addendum)
Pt to department via EMS- pt reports that he was walking down the stairs and slipped hitting lower back on the stairs. Pt reports lower back pain, denies any neck pain. Arrived on LSB, no c-collar. Bp- 140/88 Hr-70

## 2013-02-18 NOTE — ED Notes (Signed)
Asked pt to provide a urine specimen pt stated he could not provide one at this time.  

## 2013-02-18 NOTE — ED Notes (Signed)
Pt up to use the urinal in the room. Pt able ambulate in the room without difficulty.

## 2013-02-18 NOTE — ED Provider Notes (Signed)
CSN: [id number]     Arrival date & time 02/18/13  0854 History   First MD Initiated Contact with Patient 02/18/13 [phone]     Chief Complaint  Patient presents with  . Fall  . Back Pain   (Consider location/radiation/quality/duration/timing/severity/associated sxs/prior Treatment) HPI Comments: 63 yo male with h/o HTN, GERD, and Type II DM presents after fall.  Pt fell off steps of porch.  No CHI, LOC, neck pain.  Pt has pain in middle of back.  Pt was ambulatory at scene.  He went back inside and laid and called EMS.  No paralysis, paresthesias.  Pt c/o sharp pain to back.  No on blood thinners.    Patient is a 63 y.o. male presenting with fall and back pain. The history is provided by the patient and the EMS personnel.  Fall This is a new problem. The current episode started less than 1 hour ago. The problem occurs constantly. The problem has not changed since onset.Pertinent negatives include no abdominal pain.  Back Pain Associated symptoms: no abdominal pain     Past Medical History  Diagnosis Date  . Hypertension   . GERD (gastroesophageal reflux disease)   . Diabetes mellitus without complication    History reviewed. No pertinent past surgical history. History reviewed. No pertinent family history. History  Substance Use Topics  . Smoking status: Never Smoker   . Smokeless tobacco: Not on file  . Alcohol Use: 1.0 oz/week    2 drink(s) per week    Review of Systems  Unable to perform ROS Constitutional: Negative.   Eyes: Negative.   Respiratory: Negative.   Cardiovascular: Negative.   Gastrointestinal: Positive for nausea. Negative for vomiting, abdominal pain, diarrhea, constipation, blood in stool, abdominal distention, anal bleeding and rectal pain.  Endocrine: Negative.   Genitourinary: Negative.   Musculoskeletal: Positive for back pain. Negative for arthralgias, gait problem, joint swelling, myalgias, neck pain and neck stiffness.  Allergic/Immunologic:  Negative.   Neurological: Negative.   Hematological: Negative.   Psychiatric/Behavioral: Negative.   All other systems reviewed and are negative.    Allergies  Review of patient's allergies indicates no known allergies.  Home Medications   Current Outpatient Rx  Name  Route  Sig  Dispense  Refill  . allopurinol (ZYLOPRIM) 300 MG tablet   Oral   Take 300 mg by mouth daily.         Marland Kitchen amLODipine-olmesartan (AZOR) 5-40 MG per tablet   Oral   Take 1 tablet by mouth daily.         Marland Kitchen aspirin 81 MG tablet   Oral   Take 81 mg by mouth daily.         Marland Kitchen esomeprazole (NEXIUM) 40 MG capsule   Oral   Take 40 mg by mouth daily before breakfast.         . Liraglutide (VICTOZA )   Subcutaneous   Inject 2.1 mg into the skin every morning.          . metFORMIN (GLUMETZA) 1000 MG (MOD) 24 hr tablet   Oral   Take 1,000 mg by mouth 2 (two) times daily with a meal.         . nebivolol (BYSTOLIC) 10 MG tablet   Oral   Take 20 mg by mouth daily.         . pravastatin (PRAVACHOL) 80 MG tablet   Oral   Take 80 mg by mouth daily.         Marland Kitchen  methocarbamol (ROBAXIN) 500 MG tablet   Oral   Take 1 tablet (500 mg total) by mouth 2 (two) times daily.   20 tablet   0   . oxyCODONE-acetaminophen (PERCOCET/ROXICET) 5-325 MG per tablet   Oral   Take 1 tablet by mouth every 6 (six) hours as needed for severe pain.   20 tablet   0    BP 114/46  Pulse 73  Temp(Src) 97.9 F (36.6 C) (Oral)  Resp 18  Ht 5\' 8"  (1.727 m)  Wt 265 lb (120.203 kg)  BMI 40.30 kg/m2  SpO2 95% Physical Exam  Nursing note and vitals reviewed. Constitutional: He is oriented to person, place, and time. He appears well-developed and well-nourished.  HENT:  Head: Normocephalic and atraumatic.  Eyes: Conjunctivae are normal. Right eye exhibits no discharge. Left eye exhibits no discharge.  Neck: Normal range of motion. Neck supple. No JVD present.  No s/o no deviation  Cardiovascular: Normal rate  and regular rhythm.   Pulmonary/Chest: Breath sounds normal. No stridor.  Abdominal: Soft. Bowel sounds are normal. He exhibits no distension and no mass. There is no tenderness. There is no rebound and no guarding.  protuberent  Genitourinary:  Rectal and GU exam declined.  Musculoskeletal: Normal range of motion. He exhibits no edema.       Thoracic back: Normal.       Lumbar back: He exhibits tenderness, bony tenderness, pain and spasm. He exhibits no swelling, no edema, no deformity, no laceration and normal pulse.  SLR neg bilat.  No point ttp. Normal neuro exam.    Neurological: He is alert and oriented to person, place, and time.    ED Course  Procedures (including critical care time) Labs Review Labs Reviewed  CBC - Abnormal; Notable for the following:    WBC 12.5 (*)    HCT 38.7 (*)    All other components within normal limits  BASIC METABOLIC PANEL - Abnormal; Notable for the following:    Glucose, Bld 190 (*)    GFR calc non Af Amer 68 (*)    GFR calc Af Amer 79 (*)    All other components within normal limits  URINALYSIS, ROUTINE W REFLEX MICROSCOPIC - Abnormal; Notable for the following:    Bilirubin Urine SMALL (*)    Protein, ur 30 (*)    All other components within normal limits  URINE MICROSCOPIC-ADD ON - Abnormal; Notable for the following:    Casts HYALINE CASTS (*)    All other components within normal limits   Imaging Review Dg Chest 1 View  02/18/2013   CLINICAL DATA:  63 year old male status post fall with mid back pain. Initial encounter.  EXAM: CHEST - 1 VIEW  COMPARISON:  03/30/2009.  FINDINGS: Continued low lung volumes. Stable cardiac size and mediastinal contours. Visualized tracheal air column is within normal limits. No pneumothorax or pleural effusion. No confluent pulmonary opacity. Similar mild curvilinear markings adjacent to the left hilum. Grossly intact visible thorax osseous structures.  IMPRESSION: Low lung volumes. No acute cardiopulmonary  abnormality or acute traumatic injury identified.   Electronically Signed   By: Lars Pinks M.D.   On: 02/18/2013 10:31   Dg Thoracic Spine 2 View  02/18/2013   CLINICAL DATA:  63 year old male status post fall with mid back pain. Initial encounter.  EXAM: THORACIC SPINE - 2 VIEW  COMPARISON:  Chest radiographs and lumbar radiographs from the same day reported separately.  FINDINGS: Normal thoracic segmentation. Somewhat bulky flowing  osteophytes throughout the thoracic spine. No thoracic compression fracture identified. Cervicothoracic junction alignment is within normal limits. Posterior ribs appear intact.  IMPRESSION: No acute fracture or listhesis identified in the thoracic spine. Diffuse idiopathic skeletal hyperostosis .   Electronically Signed   By: Lars Pinks M.D.   On: 02/18/2013 10:37   Dg Lumbar Spine Complete  02/18/2013   CLINICAL DATA:  63 year old male status post fall with mid back pain. Initial encounter.  EXAM: LUMBAR SPINE - COMPLETE 4+ VIEW  COMPARISON:  Abdominal imaging 02/07/2006 and earlier.  FINDINGS: Normal lumbar segmentation. Stable vertebral height and alignment. sacral ala and SI joints within normal limits. Lower lumbar mild to moderate facet hypertrophy, worse on the right. No pars fracture. No lumbar compression fracture. Relatively preserved disc spaces.  Mild Aortoiliac calcified atherosclerosis noted.  IMPRESSION: No acute fracture or listhesis identified in the lumbar spine.   Electronically Signed   By: Lars Pinks M.D.   On: 02/18/2013 10:35    EKG Interpretation   None       MDM   1. Fall at home   2. Back pain    63 year old [place] American male presents emergency department with chief complaint of fall at home. Patient slipped on steps and struck his back on his porch steps. Exam is deferred at the scene. No head injury, no loss of consciousness, no neck pain, no associated injury other than his reported back pain. Neurologic exam unremarkable. No  neurologic deficit. Normal extremity exam. Negative straight leg exam. Patient has tenderness palpation to mid thoracic paravertebral muscular and mild bony tenderness palpation. No point tenderness palpation noted. Plain films of thoracic and lumbar spine were negative.  1300 Prolonged ER stay awaiting improvement of symptoms.  Pt still c/o pain.  No midline ttp.  Xrays negative.  No neurologic deficit.   Plan for second dose of morphine and robaxin.  If pain continues plan for CT abd/pelvis and T/L spine.  Pt wants to hold on CTs for now.  He wants to try next round of pain Rx.  I had a lengthy d/w pt regarding his symptoms and treatment plan.    3:16 PM Vital signs stable. Patient in relating in ER without difficulty. Comfortable with discharge home with pain medicines and muscle relaxants. ER precautions were given the patient agrees with this plan. Doubt fracture, neurologic injury, cord compression, intra-abdominal or retroperitoneal injury. Strict ER precautions were given and the patient is aware if his pain increases or worsens or persists he should return the ED at that point further imaging will be reported    Elmer Sow, MD 02/18/13 [phone]

## 2013-08-13 ENCOUNTER — Ambulatory Visit (INDEPENDENT_AMBULATORY_CARE_PROVIDER_SITE_OTHER): Payer: Federal, State, Local not specified - PPO | Admitting: Ophthalmology

## 2013-08-13 DIAGNOSIS — E1139 Type 2 diabetes mellitus with other diabetic ophthalmic complication: Secondary | ICD-10-CM

## 2013-08-13 DIAGNOSIS — H2510 Age-related nuclear cataract, unspecified eye: Secondary | ICD-10-CM

## 2013-08-13 DIAGNOSIS — E11319 Type 2 diabetes mellitus with unspecified diabetic retinopathy without macular edema: Secondary | ICD-10-CM

## 2013-08-13 DIAGNOSIS — H348392 Tributary (branch) retinal vein occlusion, unspecified eye, stable: Secondary | ICD-10-CM

## 2013-08-13 DIAGNOSIS — E1165 Type 2 diabetes mellitus with hyperglycemia: Secondary | ICD-10-CM

## 2013-08-13 DIAGNOSIS — I10 Essential (primary) hypertension: Secondary | ICD-10-CM

## 2013-08-13 DIAGNOSIS — H43819 Vitreous degeneration, unspecified eye: Secondary | ICD-10-CM

## 2013-08-13 DIAGNOSIS — H35039 Hypertensive retinopathy, unspecified eye: Secondary | ICD-10-CM

## 2014-01-14 ENCOUNTER — Inpatient Hospital Stay (HOSPITAL_COMMUNITY)
Admission: EM | Admit: 2014-01-14 | Discharge: 2014-01-16 | DRG: 378 | Disposition: A | Discharge status: Home / Self Care | Payer: Federal, State, Local not specified - PPO | Attending: Internal Medicine | Admitting: Internal Medicine

## 2014-01-14 ENCOUNTER — Encounter (HOSPITAL_COMMUNITY): Payer: Self-pay

## 2014-01-14 DIAGNOSIS — K219 Gastro-esophageal reflux disease without esophagitis: Secondary | ICD-10-CM | POA: Diagnosis present

## 2014-01-14 DIAGNOSIS — E785 Hyperlipidemia, unspecified: Secondary | ICD-10-CM | POA: Diagnosis present

## 2014-01-14 DIAGNOSIS — Z79899 Other long term (current) drug therapy: Secondary | ICD-10-CM | POA: Diagnosis not present

## 2014-01-14 DIAGNOSIS — G4733 Obstructive sleep apnea (adult) (pediatric): Secondary | ICD-10-CM | POA: Diagnosis present

## 2014-01-14 DIAGNOSIS — I10 Essential (primary) hypertension: Secondary | ICD-10-CM | POA: Diagnosis present

## 2014-01-14 DIAGNOSIS — Z7982 Long term (current) use of aspirin: Secondary | ICD-10-CM

## 2014-01-14 DIAGNOSIS — Z6841 Body Mass Index (BMI) 40.0 and over, adult: Secondary | ICD-10-CM | POA: Diagnosis not present

## 2014-01-14 DIAGNOSIS — K921 Melena: Secondary | ICD-10-CM | POA: Diagnosis present

## 2014-01-14 DIAGNOSIS — K922 Gastrointestinal hemorrhage, unspecified: Secondary | ICD-10-CM | POA: Diagnosis present

## 2014-01-14 DIAGNOSIS — D62 Acute posthemorrhagic anemia: Secondary | ICD-10-CM

## 2014-01-14 DIAGNOSIS — K254 Chronic or unspecified gastric ulcer with hemorrhage: Secondary | ICD-10-CM

## 2014-01-14 DIAGNOSIS — E1169 Type 2 diabetes mellitus with other specified complication: Secondary | ICD-10-CM

## 2014-01-14 DIAGNOSIS — E669 Obesity, unspecified: Secondary | ICD-10-CM

## 2014-01-14 DIAGNOSIS — E119 Type 2 diabetes mellitus without complications: Secondary | ICD-10-CM

## 2014-01-14 LAB — TYPE AND SCREEN
ABO/RH(D): A POS
ANTIBODY SCREEN: NEGATIVE

## 2014-01-14 LAB — COMPREHENSIVE METABOLIC PANEL
ALBUMIN: 3.2 g/dL — AB (ref 3.5–5.2)
ALT: 16 U/L (ref 0–53)
AST: 15 U/L (ref 0–37)
Alkaline Phosphatase: 63 U/L (ref 39–117)
Anion gap: 11 (ref 5–15)
BUN: 22 mg/dL (ref 6–23)
CO2: 25 meq/L (ref 19–32)
Calcium: 8.7 mg/dL (ref 8.4–10.5)
Chloride: 104 mEq/L (ref 96–112)
Creatinine, Ser: 1.21 mg/dL (ref 0.50–1.35)
GFR calc Af Amer: 73 mL/min — ABNORMAL LOW (ref >90)
GFR calc non Af Amer: 63 mL/min — ABNORMAL LOW (ref >90)
Glucose, Bld: 195 mg/dL — ABNORMAL HIGH (ref 70–99)
Potassium: 4 mEq/L (ref 3.7–5.3)
SODIUM: 140 meq/L (ref 137–147)
TOTAL PROTEIN: 6.6 g/dL (ref 6.0–8.3)
Total Bilirubin: 0.3 mg/dL (ref 0.3–1.2)

## 2014-01-14 LAB — CBC
HCT: 29.3 % — ABNORMAL LOW (ref 39.0–52.0)
HCT: 27.4 % — ABNORMAL LOW (ref 39.0–52.0)
HEMOGLOBIN: 9.3 g/dL — AB (ref 13.0–17.0)
Hemoglobin: 9.4 g/dL — ABNORMAL LOW (ref 13.0–17.0)
MCH: 26.1 pg (ref 26.0–34.0)
MCH: 27.7 pg (ref 26.0–34.0)
MCHC: 32.1 g/dL (ref 30.0–36.0)
MCHC: 33.9 g/dL (ref 30.0–36.0)
MCV: 81.4 fL (ref 78.0–100.0)
MCV: 81.5 fL (ref 78.0–100.0)
PLATELETS: 275 10*3/uL (ref 150–400)
Platelets: 306 10*3/uL (ref 150–400)
RBC: 3.6 MIL/uL — AB (ref 4.22–5.81)
RBC: 3.36 MIL/uL — ABNORMAL LOW (ref 4.22–5.81)
RDW: 14.5 % (ref 11.5–15.5)
RDW: 14.5 % (ref 11.5–15.5)
WBC: 12.9 10*3/uL — ABNORMAL HIGH (ref 4.0–10.5)
WBC: 10.8 10*3/uL — ABNORMAL HIGH (ref 4.0–10.5)

## 2014-01-14 LAB — APTT: aPTT: 30 seconds (ref 24–37)

## 2014-01-14 LAB — PROTIME-INR
INR: 1.11 (ref 0.00–1.49)
Prothrombin Time: 14.5 seconds (ref 11.6–15.2)

## 2014-01-14 LAB — POC OCCULT BLOOD, ED: Fecal Occult Bld: POSITIVE — AB

## 2014-01-14 MED ORDER — PRAVASTATIN SODIUM 80 MG PO TABS
80.0000 mg | ORAL_TABLET | Freq: Every day | ORAL | Status: DC
  Administered 2014-01-14 – 2014-01-15 (×2): 80 mg via ORAL
  Filled 2014-01-14 (×3): qty 1

## 2014-01-14 MED ORDER — SODIUM CHLORIDE 0.9 % IJ SOLN
3.0000 mL | Freq: Two times a day (BID) | INTRAMUSCULAR | Status: DC
  Administered 2014-01-14 – 2014-01-15 (×2): 3 mL via INTRAVENOUS

## 2014-01-14 MED ORDER — B COMPLEX VITAMINS PO CAPS: 1.0000 | ORAL_CAPSULE | Freq: Every day | ORAL | Status: DC

## 2014-01-14 MED ORDER — PANTOPRAZOLE SODIUM 40 MG IV SOLR
40.0000 mg | Freq: Two times a day (BID) | INTRAVENOUS | Status: DC
  Administered 2014-01-14 – 2014-01-15 (×3): 40 mg via INTRAVENOUS
  Filled 2014-01-14 (×5): qty 40

## 2014-01-14 MED ORDER — SODIUM CHLORIDE 0.9 % IV SOLN
INTRAVENOUS | Status: DC
  Administered 2014-01-14 – 2014-01-15 (×3): via INTRAVENOUS

## 2014-01-14 MED ORDER — B COMPLEX-C PO TABS
1.0000 | ORAL_TABLET | Freq: Every day | ORAL | Status: DC
  Administered 2014-01-15: 1 via ORAL
  Filled 2014-01-14 (×2): qty 1

## 2014-01-14 MED ORDER — OXYCODONE-ACETAMINOPHEN 5-325 MG PO TABS: 1.0000 | ORAL_TABLET | Freq: Four times a day (QID) | ORAL | Status: DC | PRN

## 2014-01-14 MED ORDER — ALLOPURINOL 300 MG PO TABS
300.0000 mg | ORAL_TABLET | Freq: Every day | ORAL | Status: DC
Start: 2014-01-14 — End: 2014-01-16
  Administered 2014-01-14 – 2014-01-15 (×2): 300 mg via ORAL
  Filled 2014-01-14 (×3): qty 1

## 2014-01-14 MED ORDER — METHOCARBAMOL 500 MG PO TABS
500.0000 mg | ORAL_TABLET | Freq: Two times a day (BID) | ORAL | Status: DC
  Administered 2014-01-14 – 2014-01-15 (×3): 500 mg via ORAL
  Filled 2014-01-14 (×5): qty 1

## 2014-01-14 NOTE — ED Provider Notes (Signed)
CSN: [id number]     Arrival date & time 01/14/14  1512 History   First MD Initiated Contact with Patient 01/14/14 1744     Chief Complaint  Patient presents with  . Rectal Bleeding     (Consider location/radiation/quality/duration/timing/severity/associated sxs/prior Treatment) Patient is a 63 y.o. male presenting with hematochezia.  Rectal Bleeding Quality:  Black and tarry and maroon Amount:  Moderate Duration:  2 days Timing:  Constant Progression:  Worsening Chronicity:  New Context: hemorrhoids   Context comment:  Colonocopy 3 days ago Similar prior episodes: no   Relieved by:  Nothing Worsened by:  Nothing tried Ineffective treatments:  None tried Associated symptoms: no abdominal pain, no epistaxis, no fever, no hematemesis, no loss of consciousness and no vomiting     Past Medical History  Diagnosis Date  . Hypertension   . GERD (gastroesophageal reflux disease)   . Diabetes mellitus without complication    History reviewed. No pertinent past surgical history. History reviewed. No pertinent family history. History  Substance Use Topics  . Smoking status: Never Smoker   . Smokeless tobacco: Not on file  . Alcohol Use: 1.0 oz/week    2 drink(s) per week    Review of Systems  Constitutional: Negative for fever.  HENT: Negative for nosebleeds.   Gastrointestinal: Positive for hematochezia. Negative for vomiting, abdominal pain and hematemesis.  Neurological: Negative for loss of consciousness.  All other systems reviewed and are negative.     Allergies  Review of patient's allergies indicates no known allergies.  Home Medications   Prior to Admission medications   Medication Sig Start Date End Date Taking? Authorizing Provider  allopurinol (ZYLOPRIM) 300 MG tablet Take 300 mg by mouth daily.   Yes Historical Provider, MD  amLODipine-olmesartan (AZOR) 5-40 MG per tablet Take 1 tablet by mouth daily.   Yes Historical Provider, MD  aspirin 81 MG tablet  Take 81 mg by mouth daily.   Yes Historical Provider, MD  b complex vitamins capsule Take 1 capsule by mouth daily.   Yes Historical Provider, MD  esomeprazole (NEXIUM) 40 MG capsule Take 40 mg by mouth daily before breakfast.   Yes Historical Provider, MD  Liraglutide (VICTOZA [place]) Inject 2.1 mg into the skin every morning.    Yes Historical Provider, MD  metFORMIN (GLUMETZA) 1000 MG (MOD) 24 hr tablet Take 1,000 mg by mouth daily.    Yes Historical Provider, MD  nebivolol (BYSTOLIC) 10 MG tablet Take 20 mg by mouth daily.   Yes Historical Provider, MD  pravastatin (PRAVACHOL) 80 MG tablet Take 80 mg by mouth daily.   Yes Historical Provider, MD  methocarbamol (ROBAXIN) 500 MG tablet Take 1 tablet (500 mg total) by mouth 2 (two) times daily. Patient not taking: Reported on 01/14/2014 02/18/13   Elmer Sow, MD  oxyCODONE-acetaminophen (PERCOCET/ROXICET) 5-325 MG per tablet Take 1 tablet by mouth every 6 (six) hours as needed for severe pain. 01/16/14   Theodis Blaze, MD   BP 130/76 mmHg  Pulse 73  Temp(Src) 98.2 F (36.8 C) (Oral)  Resp 20  Ht 5\' 8"  (1.727 m)  Wt 254 lb 6.6 oz (115.4 kg)  BMI 38.69 kg/m2  SpO2 99% Physical Exam  Constitutional: He is oriented to person, place, and time. He appears well-developed and well-nourished.  HENT:  Head: Normocephalic and atraumatic.  Eyes: Conjunctivae and EOM are normal.  Neck: Normal range of motion. Neck supple.  Cardiovascular: Normal rate, regular rhythm and normal heart sounds.  Pulmonary/Chest: Effort normal and breath sounds normal. No respiratory distress.  Abdominal: He exhibits no distension. There is no tenderness. There is no rebound and no guarding.  Genitourinary: Rectal exam shows no fissure. Guaiac positive stool.  Musculoskeletal: Normal range of motion.  Neurological: He is alert and oriented to person, place, and time.  Skin: Skin is warm and dry.  Vitals reviewed.   ED Course  Procedures (including critical care  time) Labs Review Labs Reviewed  CBC - Abnormal; Notable for the following:    WBC 12.9 (*)    RBC 3.60 (*)    Hemoglobin 9.4 (*)    HCT 29.3 (*)    All other components within normal limits  COMPREHENSIVE METABOLIC PANEL - Abnormal; Notable for the following:    Glucose, Bld 195 (*)    Albumin 3.2 (*)    GFR calc non Af Amer 63 (*)    GFR calc Af Amer 73 (*)    All other components within normal limits  CBC - Abnormal; Notable for the following:    WBC 10.8 (*)    RBC 3.36 (*)    Hemoglobin 9.3 (*)    HCT 27.4 (*)    All other components within normal limits  CBC - Abnormal; Notable for the following:    RBC 3.17 (*)    Hemoglobin 8.4 (*)    HCT 25.6 (*)    All other components within normal limits  CBC - Abnormal; Notable for the following:    RBC 3.25 (*)    Hemoglobin 8.5 (*)    HCT 26.3 (*)    All other components within normal limits  COMPREHENSIVE METABOLIC PANEL - Abnormal; Notable for the following:    Glucose, Bld 151 (*)    Albumin 3.1 (*)    Total Bilirubin <0.2 (*)    GFR calc non Af Amer 68 (*)    GFR calc Af Amer 78 (*)    All other components within normal limits  CBC - Abnormal; Notable for the following:    RBC 3.05 (*)    Hemoglobin 8.2 (*)    HCT 24.5 (*)    All other components within normal limits  HEMOGLOBIN A1C - Abnormal; Notable for the following:    Hgb A1c MFr Bld 6.7 (*)    Mean Plasma Glucose 146 (*)    All other components within normal limits  GLUCOSE, CAPILLARY - Abnormal; Notable for the following:    Glucose-Capillary 151 (*)    All other components within normal limits  CBC - Abnormal; Notable for the following:    RBC 3.30 (*)    Hemoglobin 8.7 (*)    HCT 26.8 (*)    All other components within normal limits  BASIC METABOLIC PANEL - Abnormal; Notable for the following:    Glucose, Bld 163 (*)    GFR calc non Af Amer 68 (*)    GFR calc Af Amer 79 (*)    All other components within normal limits  GLUCOSE, CAPILLARY -  Abnormal; Notable for the following:    Glucose-Capillary 114 (*)    All other components within normal limits  GLUCOSE, CAPILLARY - Abnormal; Notable for the following:    Glucose-Capillary 159 (*)    All other components within normal limits  GLUCOSE, CAPILLARY - Abnormal; Notable for the following:    Glucose-Capillary 143 (*)    All other components within normal limits  POC OCCULT BLOOD, ED - Abnormal; Notable for the following:  Fecal Occult Bld POSITIVE (*)    All other components within normal limits  PROTIME-INR  APTT  TYPE AND SCREEN  ABO/RH    Imaging Review No results found.   EKG Interpretation None      MDM   Final diagnoses:  Gastrointestinal hemorrhage with melena  Acute blood loss anemia    64 y.o. male with pertinent PMH of GERD, HTN, DM presents with rectal bleeding after recent colonoscopy.  Pt with maroon colored stool on rectal exam.  No obvious other explanation. No upper GI symptoms.    Labs with anemia, acute from prior, likely due to blood loss. I spoke with Dr. Earlean Shawl (who performed recent colonoscopy) who recommended hospitalist admission, NPO pending recheck in AM.   His cell number is [phone], and he encouraged calls with any other questions 1. Gastrointestinal hemorrhage with melena   2. Acute blood loss anemia          Debby Freiberg, MD 01/18/14 [phone]

## 2014-01-14 NOTE — ED Notes (Signed)
Pt had colonoscopy on Monday.  Started having black stools yesterday 4-5 episodes.  MD states to come here.

## 2014-01-14 NOTE — ED Notes (Signed)
MD at bedside. 

## 2014-01-14 NOTE — H&P (Signed)
[facility] History and Physical  Joshua Roberts DOB: 1950/10/13 DOA: 01/14/2014  Referring physician: ED physician PCP: Dwan Bolt, MD  Specialists:   Chief Complaint: Black stool after colonoscopy  HPI: Joshua Roberts is a 63 y.o. male with past medical history of GERD, hypertension, HLD, OSA, diabetes mellitus, who presents with black stool.  Patient reports that he had a colonoscopy 4 days ago. 3 polyps was removed. Today he noticed black stool. He had 4-5 times of bowel movement, all with black stools. He does not have nausea, vomiting, diarrhea, abdominal pain. He does not have chest pain or dizziness. He called his PCP, Dr. Wilson Singer, who suggest patient to come to the emergency room for further evaluation and treatment. Patient denies recent and NSIADs use.  She denies fever, chills, fatigue, headaches, cough, chest pain, SOB, abdominal pain, dysuria, urgency, frequency, hematuria, skin rashes, joint pain or leg swelling.  Work up in the ED demonstrates hemoglobin decreased from 13.2 on 02/18/13 to 9.4. FOBT positive. Patient is admitted to inpatient for further evaluation and treatment. GI was consulted by ED.  Review of Systems: As presented in the history of presenting illness, rest negative.  Where does patient live?  At home Can patient participate in ADLs? Yes  Allergy: No Known Allergies  Past Medical History  Diagnosis Date  . Hypertension   . GERD (gastroesophageal reflux disease)   . Diabetes mellitus without complication     History reviewed. No pertinent past surgical history.  Social History:  reports that he has never smoked. He does not have any smokeless tobacco history on file. He reports that he drinks about 1.0 oz of alcohol per week. His drug history is not on file.  Family History: History reviewed. No pertinent family history.   Prior to Admission medications   Medication Sig Start Date End Date Taking? Authorizing  Provider  allopurinol (ZYLOPRIM) 300 MG tablet Take 300 mg by mouth daily.   Yes Historical Provider, MD  amLODipine-olmesartan (AZOR) 5-40 MG per tablet Take 1 tablet by mouth daily.   Yes Historical Provider, MD  aspirin 81 MG tablet Take 81 mg by mouth daily.   Yes Historical Provider, MD  b complex vitamins capsule Take 1 capsule by mouth daily.   Yes Historical Provider, MD  esomeprazole (NEXIUM) 40 MG capsule Take 40 mg by mouth daily before breakfast.   Yes Historical Provider, MD  Liraglutide (VICTOZA [place]) Inject 2.1 mg into the skin every morning.    Yes Historical Provider, MD  metFORMIN (GLUMETZA) 1000 MG (MOD) 24 hr tablet Take 1,000 mg by mouth daily.    Yes Historical Provider, MD  nebivolol (BYSTOLIC) 10 MG tablet Take 20 mg by mouth daily.   Yes Historical Provider, MD  pravastatin (PRAVACHOL) 80 MG tablet Take 80 mg by mouth daily.   Yes Historical Provider, MD  methocarbamol (ROBAXIN) 500 MG tablet Take 1 tablet (500 mg total) by mouth 2 (two) times daily. Patient not taking: Reported on 01/14/2014 02/18/13   Elmer Sow, MD  oxyCODONE-acetaminophen (PERCOCET/ROXICET) 5-325 MG per tablet Take 1 tablet by mouth every 6 (six) hours as needed for severe pain. Patient not taking: Reported on 01/14/2014 02/18/13   Elmer Sow, MD    Physical Exam: Filed Vitals:   01/14/14 1532 01/14/14 1901 01/14/14 1903 01/14/14 1910  BP: 119/55 106/55    Pulse: 98  78   Temp: 98.7 F (37.1 C)     TempSrc: Oral     Resp:  20   16  SpO2: 99%  98%    General: Not in acute distress HEENT:       Eyes: PERRL, EOMI, no scleral icterus       ENT: No discharge from the ears and nose, no pharynx injection, no tonsillar enlargement.        Neck: No JVD, no bruit, no mass felt. Cardiac: S1/S2, RRR, No murmurs, No gallops or rubs Pulm: Good air movement bilaterally. Clear to auscultation bilaterally. No rales, wheezing, rhonchi or rubs. Abd: Soft, nondistended, nontender, no rebound pain, no  organomegaly, BS present Ext: No edema bilaterally. 2+DP/PT pulse bilaterally Musculoskeletal: No joint deformities, erythema, or stiffness, ROM full Skin: No rashes.  Neuro: Alert and oriented X3, cranial nerves II-XII grossly intact, muscle strength 5/5 in all extremeties, sensation to light touch intact.  Psych: Patient is not psychotic, no suicidal or hemocidal ideation.  Labs on Admission:  Basic Metabolic Panel:  Recent Labs Lab 01/14/14 1653  NA 140  K 4.0  CL 104  CO2 25  GLUCOSE 195*  BUN 22  CREATININE 1.21  CALCIUM 8.7   Liver Function Tests:  Recent Labs Lab 01/14/14 1653  AST 15  ALT 16  ALKPHOS 63  BILITOT 0.3  PROT 6.6  ALBUMIN 3.2*   No results for input(s): LIPASE, AMYLASE in the last 168 hours. No results for input(s): AMMONIA in the last 168 hours. CBC:  Recent Labs Lab 01/14/14 1653  WBC 12.9*  HGB 9.4*  HCT 29.3*  MCV 81.4  PLT 306   Cardiac Enzymes: No results for input(s): CKTOTAL, CKMB, CKMBINDEX, TROPONINI in the last 168 hours.  BNP (last 3 results) No results for input(s): PROBNP in the last 8760 hours. CBG: No results for input(s): GLUCAP in the last 168 hours.  Radiological Exams on Admission: No results found.  EKG: Independently reviewed.   Assessment/Plan Principal Problem:   GI bleed Active Problems:   Obstructive sleep apnea:    Obesity, Class III, BMI 40-49.9 (morbid obesity)   Essential hypertension   Hyperlipidemia   Diabetes mellitus type 2 in obese  GIB: Patient GI bleeding may be related to recent colonoscopy and the polypectomy, however the black stool is not completely consistent with lower GI bleeding. GI was consulted by ED. - will admit to tele bed - GI consulted by Ed - NPO  - NS at 125 mL/hr - Start IV pantoprazole 40 mg bib - Avoid NSAIDs and SQ heparin - Monitor closely and follow q6h cbc, transfuse as necessary. - LaB: INR -Hold aspirin  Hypertension: Blood pressure is a soft on  admission -Hold amlodipine and Nebivolol  Diabetes mellitus: Last A1c was 7 at 5 years ago. Patient is on metformin and Victoza at home -Switch oral medications to sliding scale insulin -check A1c  HLD:  -Continue pravastatin  DVT ppx: SCD Code Status: Full code Family Communication: None at bed side.  Disposition Plan: Admit to inpatient   Date of Service 01/14/2014    Ivor Costa [facility] Pager [phone]  If 7PM-7AM, please contact night-coverage [email or web address] Password [email or web address] 01/14/2014, 7:19 PM

## 2014-01-15 LAB — COMPREHENSIVE METABOLIC PANEL
ALT: 21 U/L (ref 0–53)
ANION GAP: 12 (ref 5–15)
AST: 16 U/L (ref 0–37)
Albumin: 3.1 g/dL — ABNORMAL LOW (ref 3.5–5.2)
Alkaline Phosphatase: 59 U/L (ref 39–117)
BUN: 19 mg/dL (ref 6–23)
CHLORIDE: 105 meq/L (ref 96–112)
CO2: 23 meq/L (ref 19–32)
CREATININE: 1.14 mg/dL (ref 0.50–1.35)
Calcium: 8.4 mg/dL (ref 8.4–10.5)
GFR calc Af Amer: 78 mL/min — ABNORMAL LOW (ref >90)
GFR, EST NON AFRICAN AMERICAN: 68 mL/min — AB (ref >90)
Glucose, Bld: 151 mg/dL — ABNORMAL HIGH (ref 70–99)
Potassium: 4.2 mEq/L (ref 3.7–5.3)
Sodium: 140 mEq/L (ref 137–147)
Total Protein: 6.3 g/dL (ref 6.0–8.3)

## 2014-01-15 LAB — GLUCOSE, CAPILLARY
GLUCOSE-CAPILLARY: 114 mg/dL — AB (ref 70–99)
GLUCOSE-CAPILLARY: 159 mg/dL — AB (ref 70–99)
Glucose-Capillary: 151 mg/dL — ABNORMAL HIGH (ref 70–99)

## 2014-01-15 LAB — CBC
HCT: 25.6 % — ABNORMAL LOW (ref 39.0–52.0)
HCT: 26.3 % — ABNORMAL LOW (ref 39.0–52.0)
HCT: 24.5 % — ABNORMAL LOW (ref 39.0–52.0)
Hemoglobin: 8.4 g/dL — ABNORMAL LOW (ref 13.0–17.0)
Hemoglobin: 8.5 g/dL — ABNORMAL LOW (ref 13.0–17.0)
Hemoglobin: 8.2 g/dL — ABNORMAL LOW (ref 13.0–17.0)
MCH: 26.5 pg (ref 26.0–34.0)
MCH: 26.2 pg (ref 26.0–34.0)
MCH: 26.9 pg (ref 26.0–34.0)
MCHC: 32.8 g/dL (ref 30.0–36.0)
MCHC: 32.3 g/dL (ref 30.0–36.0)
MCHC: 33.5 g/dL (ref 30.0–36.0)
MCV: 80.8 fL (ref 78.0–100.0)
MCV: 80.9 fL (ref 78.0–100.0)
MCV: 80.3 fL (ref 78.0–100.0)
PLATELETS: 242 10*3/uL (ref 150–400)
Platelets: 216 10*3/uL (ref 150–400)
Platelets: 207 10*3/uL (ref 150–400)
RBC: 3.17 MIL/uL — ABNORMAL LOW (ref 4.22–5.81)
RBC: 3.25 MIL/uL — ABNORMAL LOW (ref 4.22–5.81)
RBC: 3.05 MIL/uL — ABNORMAL LOW (ref 4.22–5.81)
RDW: 14.5 % (ref 11.5–15.5)
RDW: 14.4 % (ref 11.5–15.5)
RDW: 14.4 % (ref 11.5–15.5)
WBC: 10.2 10*3/uL (ref 4.0–10.5)
WBC: 9.2 10*3/uL (ref 4.0–10.5)
WBC: 9.2 10*3/uL (ref 4.0–10.5)

## 2014-01-15 LAB — HEMOGLOBIN A1C
HEMOGLOBIN A1C: 6.7 % — AB (ref <5.7)
Mean Plasma Glucose: 146 mg/dL — ABNORMAL HIGH (ref <117)

## 2014-01-15 LAB — ABO/RH: ABO/RH(D): A POS

## 2014-01-15 MED ORDER — INSULIN ASPART 100 UNIT/ML ~~LOC~~ SOLN
0.0000 [IU] | Freq: Three times a day (TID) | SUBCUTANEOUS | Status: DC
  Administered 2014-01-16: 1 [IU] via SUBCUTANEOUS

## 2014-01-15 MED ORDER — MUSCLE RUB 10-15 % EX CREA
TOPICAL_CREAM | Freq: Four times a day (QID) | CUTANEOUS | Status: DC | PRN
  Administered 2014-01-15: 1 via TOPICAL
  Filled 2014-01-15: qty 85

## 2014-01-15 NOTE — Plan of Care (Signed)
Problem: Phase I Progression Outcomes Goal: Voiding-avoid urinary catheter unless indicated Outcome: Completed/Met Date Met:  01/15/14

## 2014-01-15 NOTE — Progress Notes (Signed)
Patient ID: Joshua Roberts, male   DOB: 1950/08/20, 63 y.o.   MRN: [id number]  [facility] PROGRESS NOTE  REGGIE BISE EPP:[id number] DOB: 05-05-1950 DOA: 01/14/2014 PCP: Dwan Bolt, MD  Brief narrative: 63 y.o. male with past medical history of GERD, hypertension, HLD, OSA, diabetes mellitus, who presented with black stools post polypectomy during colonoscopy   Assessment and Plan:    Principal Problem:   GI bleed - no further blood in stool per pt - spoke with pt's Gi doctor who recommended observation and if no further bleed possible d/c home in AM - repeat CBC in AM Active Problems:   Obstructive sleep apnea - clinically stable this AM, maintaining oxygen saturation at target range    Obesity, Class III, BMI 40-49.9 (morbid obesity)   Essential hypertension - reasonable inpatient control    Hyperlipidemia - continue statin    Diabetes mellitus type 2 in obese - reasonable inpatient control   DVT prophylaxis  SCD's  Code Status: Full Family Communication: Pt at bedside Disposition Plan: Home when medically stable   IV Access:   Peripheral IV Procedures and diagnostic studies:    No results found.  Medical Consultants:   None Other Consultants:   None   Anti-Infectives:   None   Faye Ramsay, MD  [facility] Pager [phone]  If 7PM-7AM, please contact night-coverage [email or web address] Password [facility] 01/15/2014, 7:17 PM   LOS: 1 day   HPI/Subjective: No events overnight.   Objective: Filed Vitals:   01/14/14 1910 01/14/14 2040 01/15/14 0452 01/15/14 1457  BP:  120/67 115/63 139/68  Pulse:  82 92 70  Temp:  98.3 F (36.8 C) 98.4 F (36.9 C) 98.4 F (36.9 C)  TempSrc:  Oral Oral Oral  Resp: 16 18 18 16   Height:  5\' 8"  (1.727 m)    Weight:  117.482 kg (259 lb) 117.164 kg (258 lb 4.8 oz)   SpO2:  100% 98% 99%    Intake/Output Summary (Last 24 hours) at 01/15/14 1917 Last data filed at 01/15/14 1736  Gross per 24 hour  Intake  2508.33 ml  Output      0 ml  Net 2508.33 ml    Exam:   General:  Pt is alert, follows commands appropriately, not in acute distress  Cardiovascular: Regular rate and rhythm, S1/S2, no murmurs, no rubs, no gallops  Respiratory: Clear to auscultation bilaterally, no wheezing, no crackles, no rhonchi  Abdomen: Soft, non tender, non distended, bowel sounds present, no guarding  Extremities: No edema, pulses DP and PT palpable bilaterally  Neuro: Grossly nonfocal  Data Reviewed: Basic Metabolic Panel:  Recent Labs Lab 01/14/14 1653 01/15/14 0255  NA 140 140  K 4.0 4.2  CL 104 105  CO2 25 23  GLUCOSE 195* 151*  BUN 22 19  CREATININE 1.21 1.14  CALCIUM 8.7 8.4   Liver Function Tests:  Recent Labs Lab 01/14/14 1653 01/15/14 0255  AST 15 16  ALT 16 21  ALKPHOS 63 59  BILITOT 0.3 <0.2*  PROT 6.6 6.3  ALBUMIN 3.2* 3.1*   CBC:  Recent Labs Lab 01/14/14 1653 01/14/14 2130 01/15/14 0255 01/15/14 0815 01/15/14 1443  WBC 12.9* 10.8* 10.2 9.2 9.2  HGB 9.4* 9.3* 8.4* 8.5* 8.2*  HCT 29.3* 27.4* 25.6* 26.3* 24.5*  MCV 81.4 81.5 80.8 80.9 80.3  PLT 306 275 242 216 207   CBG:  Recent Labs Lab 01/15/14 0742 01/15/14 1851  GLUCAP 151* 114*    Scheduled Meds: . allopurinol  300 mg Oral Daily  . B-complex with vitamin C  1 tablet Oral Daily  . insulin aspart  0-9 Units Subcutaneous TID WC  . methocarbamol  500 mg Oral BID  . pantoprazole (PROTONIX) IV  40 mg Intravenous Q12H  . pravastatin  80 mg Oral Daily  . sodium chloride  3 mL Intravenous Q12H   Continuous Infusions:

## 2014-01-16 LAB — BASIC METABOLIC PANEL
ANION GAP: 13 (ref 5–15)
BUN: 12 mg/dL (ref 6–23)
CO2: 25 meq/L (ref 19–32)
CREATININE: 1.13 mg/dL (ref 0.50–1.35)
Calcium: 8.8 mg/dL (ref 8.4–10.5)
Chloride: 100 mEq/L (ref 96–112)
GFR calc Af Amer: 79 mL/min — ABNORMAL LOW (ref >90)
GFR calc non Af Amer: 68 mL/min — ABNORMAL LOW (ref >90)
Glucose, Bld: 163 mg/dL — ABNORMAL HIGH (ref 70–99)
Potassium: 3.8 mEq/L (ref 3.7–5.3)
SODIUM: 138 meq/L (ref 137–147)

## 2014-01-16 LAB — CBC
HCT: 26.8 % — ABNORMAL LOW (ref 39.0–52.0)
Hemoglobin: 8.7 g/dL — ABNORMAL LOW (ref 13.0–17.0)
MCH: 26.4 pg (ref 26.0–34.0)
MCHC: 32.5 g/dL (ref 30.0–36.0)
MCV: 81.2 fL (ref 78.0–100.0)
Platelets: 255 10*3/uL (ref 150–400)
RBC: 3.3 MIL/uL — ABNORMAL LOW (ref 4.22–5.81)
RDW: 14.3 % (ref 11.5–15.5)
WBC: 9.6 10*3/uL (ref 4.0–10.5)

## 2014-01-16 LAB — GLUCOSE, CAPILLARY: Glucose-Capillary: 143 mg/dL — ABNORMAL HIGH (ref 70–99)

## 2014-01-16 MED ORDER — OXYCODONE-ACETAMINOPHEN 5-325 MG PO TABS: 1.0000 | ORAL_TABLET | Freq: Four times a day (QID) | ORAL | Status: DC | PRN

## 2014-01-16 NOTE — Discharge Instructions (Signed)

## 2014-01-16 NOTE — Discharge Summary (Signed)
Physician Discharge Summary  Joshua Roberts RWE:[id number] DOB: [birth year] DOA: 01/14/2014  PCP: Dwan Bolt, MD  Admit date: 01/14/2014 Discharge date: 01/16/2014  Recommendations for Outpatient Follow-up:  1. Pt will need to follow up with PCP in 2-3 weeks post discharge 2. Please also check CBC to evaluate Hg and Hct levels  Discharge Diagnoses:  Principal Problem:   GI bleed Active Problems:   Obstructive sleep apnea:    Obesity, Class III, BMI 40-49.9 (morbid obesity)   Essential hypertension   Hyperlipidemia   Diabetes mellitus type 2 in obese   GIB (gastrointestinal bleeding)    Discharge Condition: Stable  Diet recommendation: Heart healthy diet discussed in details    Brief narrative: 63 y.o. male with past medical history of GERD, hypertension, HLD, OSA, diabetes mellitus, who presented with black stools post polypectomy during colonoscopy   Assessment and Plan:    Principal Problem:  GI bleed - no further blood in stool per pt - spoke with pt's Gi doctor who recommended observation - repeat CBC stable this AM and pt wants to go home  - stable for d/c Active Problems:  Obstructive sleep apnea - clinically stable this AM, maintaining oxygen saturation at target range   Obesity, Class III, BMI 40-49.9 (morbid obesity)  Essential hypertension - reasonable inpatient control   Hyperlipidemia - continue statin   Diabetes mellitus type 2 in obese - reasonable inpatient control   DVT prophylaxis  SCD's  Code Status: Full Family Communication: Pt at bedside Disposition Plan: Home when medically stable   IV Access:    Peripheral IV Procedures and diagnostic studies:      Imaging Results (Last 48 hours)    No results found.   Medical Consultants:    None Other Consultants:    None  Anti-Infectives:    None    Discharge Exam: Filed Vitals:   01/16/14 0636  BP: 130/76  Pulse: 73  Temp: 98.2 F (36.8 C)   Resp: 20   Filed Vitals:   01/15/14 0452 01/15/14 1457 01/15/14 2056 01/16/14 0636  BP: 115/63 139/68 131/65 130/76  Pulse: 92 70 79 73  Temp: 98.4 F (36.9 C) 98.4 F (36.9 C) 98.3 F (36.8 C) 98.2 F (36.8 C)  TempSrc: Oral Oral Oral Oral  Resp: 18 16 20 20   Height:      Weight: 117.164 kg (258 lb 4.8 oz)   115.4 kg (254 lb 6.6 oz)  SpO2: 98% 99% 100% 99%    General: Pt is alert, follows commands appropriately, not in acute distress Cardiovascular: Regular rate and rhythm, S1/S2 +, no murmurs, no rubs, no gallops Respiratory: Clear to auscultation bilaterally, no wheezing, no crackles, no rhonchi Abdominal: Soft, non tender, non distended, bowel sounds +, no guarding Extremities: no edema, no cyanosis, pulses palpable bilaterally DP and PT Neuro: Grossly nonfocal  Discharge Instructions  Discharge Instructions    Diet - low sodium heart healthy    Complete by:  As directed      Increase activity slowly    Complete by:  As directed             Medication List    TAKE these medications        allopurinol 300 MG tablet  Commonly known as:  ZYLOPRIM  Take 300 mg by mouth daily.     aspirin 81 MG tablet  Take 81 mg by mouth daily.     AZOR 5-40 MG per tablet  Generic drug:  amLODipine-olmesartan  Take 1 tablet by mouth daily.     b complex vitamins capsule  Take 1 capsule by mouth daily.     esomeprazole 40 MG capsule  Commonly known as:  NEXIUM  Take 40 mg by mouth daily before breakfast.     metFORMIN 1000 MG (MOD) 24 hr tablet  Commonly known as:  GLUMETZA  Take 1,000 mg by mouth daily.     methocarbamol 500 MG tablet  Commonly known as:  ROBAXIN  Take 1 tablet (500 mg total) by mouth 2 (two) times daily.     nebivolol 10 MG tablet  Commonly known as:  BYSTOLIC  Take 20 mg by mouth daily.     oxyCODONE-acetaminophen 5-325 MG per tablet  Commonly known as:  PERCOCET/ROXICET  Take 1 tablet by mouth every 6 (six) hours as needed for severe pain.      pravastatin 80 MG tablet  Commonly known as:  PRAVACHOL  Take 80 mg by mouth daily.     VICTOZA [place]  Inject 2.1 mg into the skin every morning.           Follow-up Information    Follow up with Dwan Bolt, MD.   Specialty:  Endocrinology   Contact information:   [address] [place] [address] [phone]       Follow up with MEDOFF,JEFFREY R, MD.   Specialty:  Gastroenterology   Contact information:   [place] [address] [phone]       Follow up with Faye Ramsay, MD.   Specialty:  Internal Medicine   Why:  If symptoms worsen call my cell phone [phone]   Contact information:   [address] [place] [address] [phone]        The results of significant diagnostics from this hospitalization (including imaging, microbiology, ancillary and laboratory) are listed below for reference.     Microbiology: No results found for this or any previous visit (from the past 240 hour(s)).   Labs: Basic Metabolic Panel:  Recent Labs Lab 01/14/14 1653 01/15/14 0255 01/16/14 0405  NA 140 140 138  K 4.0 4.2 3.8  CL 104 105 100  CO2 25 23 25   GLUCOSE 195* 151* 163*  BUN 22 19 12   CREATININE 1.21 1.14 1.13  CALCIUM 8.7 8.4 8.8   Liver Function Tests:  Recent Labs Lab 01/14/14 1653 01/15/14 0255  AST 15 16  ALT 16 21  ALKPHOS 63 59  BILITOT 0.3 <0.2*  PROT 6.6 6.3  ALBUMIN 3.2* 3.1*   No results for input(s): LIPASE, AMYLASE in the last 168 hours. No results for input(s): AMMONIA in the last 168 hours. CBC:  Recent Labs Lab 01/14/14 2130 01/15/14 0255 01/15/14 0815 01/15/14 1443 01/16/14 0405  WBC 10.8* 10.2 9.2 9.2 9.6  HGB 9.3* 8.4* 8.5* 8.2* 8.7*  HCT 27.4* 25.6* 26.3* 24.5* 26.8*  MCV 81.5 80.8 80.9 80.3 81.2  PLT 275 242 216 207 255   Cardiac Enzymes: No results for input(s): CKTOTAL, CKMB, CKMBINDEX, TROPONINI in the last 168  hours. BNP: BNP (last 3 results) No results for input(s): PROBNP in the last 8760 hours. CBG:  Recent Labs Lab 01/15/14 0742 01/15/14 1851 01/15/14 2054 01/16/14 0717  GLUCAP 151* 114* 159* 143*     SIGNED: Time coordinating discharge: Over 30 minutes  MAGICK-Oluwanifemi Susman, MD  [facility] 01/16/2014, 9:00 AM Pager [phone]  If 7PM-7AM, please contact night-coverage [email or web address] Password [email or web address]

## 2014-05-17 ENCOUNTER — Ambulatory Visit (INDEPENDENT_AMBULATORY_CARE_PROVIDER_SITE_OTHER): Payer: Federal, State, Local not specified - PPO | Admitting: Ophthalmology

## 2017-04-22 ENCOUNTER — Institutional Professional Consult (permissible substitution): Payer: Federal, State, Local not specified - PPO | Admitting: Neurology

## 2017-05-28 ENCOUNTER — Encounter: Payer: Self-pay | Admitting: Neurology

## 2017-06-04 ENCOUNTER — Encounter: Payer: Self-pay | Admitting: Neurology

## 2017-06-04 ENCOUNTER — Institutional Professional Consult (permissible substitution): Payer: Federal, State, Local not specified - PPO | Admitting: Neurology

## 2017-06-04 ENCOUNTER — Telehealth: Payer: Self-pay | Admitting: Neurology

## 2017-06-04 NOTE — Telephone Encounter (Signed)
Pt no showed to apt today 

## 2017-06-07 DIAGNOSIS — C61 Malignant neoplasm of prostate: Secondary | ICD-10-CM | POA: Diagnosis not present

## 2017-06-07 DIAGNOSIS — R972 Elevated prostate specific antigen [PSA]: Secondary | ICD-10-CM | POA: Diagnosis not present

## 2017-06-11 DIAGNOSIS — R799 Abnormal finding of blood chemistry, unspecified: Secondary | ICD-10-CM | POA: Diagnosis not present

## 2017-06-11 DIAGNOSIS — E119 Type 2 diabetes mellitus without complications: Secondary | ICD-10-CM | POA: Diagnosis not present

## 2017-06-12 DIAGNOSIS — R799 Abnormal finding of blood chemistry, unspecified: Secondary | ICD-10-CM | POA: Diagnosis not present

## 2017-06-18 DIAGNOSIS — E119 Type 2 diabetes mellitus without complications: Secondary | ICD-10-CM | POA: Diagnosis not present

## 2017-06-18 DIAGNOSIS — I10 Essential (primary) hypertension: Secondary | ICD-10-CM | POA: Diagnosis not present

## 2017-07-09 ENCOUNTER — Encounter: Payer: Self-pay | Admitting: Neurology

## 2017-07-09 ENCOUNTER — Ambulatory Visit (INDEPENDENT_AMBULATORY_CARE_PROVIDER_SITE_OTHER): Payer: Medicare Other | Admitting: Neurology

## 2017-07-09 VITALS — BP 146/77 | HR 62 | Ht 67.0 in | Wt 255.0 lb

## 2017-07-09 DIAGNOSIS — G4733 Obstructive sleep apnea (adult) (pediatric): Secondary | ICD-10-CM | POA: Diagnosis not present

## 2017-07-09 DIAGNOSIS — N182 Chronic kidney disease, stage 2 (mild): Secondary | ICD-10-CM

## 2017-07-09 DIAGNOSIS — I150 Renovascular hypertension: Secondary | ICD-10-CM

## 2017-07-09 DIAGNOSIS — Z72821 Inadequate sleep hygiene: Secondary | ICD-10-CM | POA: Insufficient documentation

## 2017-07-09 DIAGNOSIS — Z9989 Dependence on other enabling machines and devices: Secondary | ICD-10-CM

## 2017-07-09 DIAGNOSIS — G4700 Insomnia, unspecified: Secondary | ICD-10-CM

## 2017-07-09 NOTE — Addendum Note (Signed)
Addended by: Larey Seat on: 07/09/2017 09:48 AM   Modules accepted: Orders

## 2017-07-09 NOTE — Progress Notes (Signed)
SLEEP MEDICINE CLINIC   Provider:  Larey Seat, M D  Primary Care Physician:  Deland Pretty, MD   Referring Provider: Anda Kraft, MD    Chief Complaint  Patient presents with  . New Patient (Initial Visit)    Room 11. Patient is alone.     HPI: 07-09-2017  Joshua Roberts is a 67 y.o. male , seen here a in a referral from Dr. Shelia Media for evaluation of EDS, excessive daytime sleepiness.   He is a diabetic, obese, arthritis and gout afflicted male patient, the patient is treated with abdomen at bedtime as needed, he also takes Celebrex, aspirin, tramadol as needed, allopurinol for gout, Pravachol to reduce cholesterol, metformin, Nexium, Azor, diastolic.  The patient was in the past diagnosed with obstructive sleep apnea but inconsistently used CPAP his last test was in 2003.  In the meantime he has progressed to hypertensive retinopathy, retinopathy, macular degeneration hypertension and GERD.  He has also high triglycerides low HDL and an HbA1c has been elevated.  PSA has been elevated.  The last labs that could reflect on all from 06 February 2017.  He had no recent major hospitalizations, accidents or surgeries. He had a colonoscopy in 2008.  I also noticed a reduced hemoglobin and hematocrit he is borderline anemic.  Hemoglobin A1c in 2007 was 7.1 in 2018 9.0 in October 2018 8.3.  Dr. Shelia Media consider this patient untreated for obstructive sleep apnea.  His BMI is now over 14 but under 89 and he also has gastroesophageal acid reflux hypertension diabetes all factors that can contribute along with untreated OSA to strokes and heart disease and vascular disease in general.   Chief complaint according to patient : " I am very sleepy - but it was not my idea to come here".    Sleep habits are as follows: The patient spends the last hour before he goes to sleep in his bedroom and he watches TV there. He spends more time in bed than in the living room. He sleeps on 2 pillows, on his back or  on his side. "When I get tired , I switch it off- sometimes not all night " . Average sleep time at midnight.  4 times nocturia, every 2 hours. He is currently waking with an alarm 6 AM Friday and Saturday , and feels not rested. Dry mouth in AM , but no headaches. Loud snoring- he is woken by his own snoring. Gasping for air is rare. Sleeps often when sitting at rest.  Average sleep time at night 5 hours, and naps in daytime- 30 minutes in front of the running TV>        Sleep medical history and family sleep history: I was able to review the previous sleep studies, one procedure date was September 26, 1999 he was diagnosed with an RDI of 72/h and severe desaturations but no cardiac arrhythmia or dysrhythmia.  Moderate snoring and leg jerking.  He still reads 13% REM sleep, and overall 400 minutes of sleep time.  Average oxygen saturation however was just borderline.  In minimum heart rate was 36/h which I consider bradycardia.  He was also referred for a cardiopulmonary exercise test in 2013 at the time by Dr. Wilson Singer -  Date of the study was 23 May 2011 and he had low exercise capacity, low spirometry, but normal pulmonary breathing reserve and cardiovascular variability.   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  Sometimes he works nights, sometimes daytime.   Works part time, retired form main job 5 years ago at age 25. Worked for the [employer]. Non smoker, ETOH - socially in the past, 3 drinks in the last 5 years, caffeine - yes, soda, soda, iced sweetened tea. No coffee.    Review of Systems: Out of a complete 14 system review, the patient complains of only the following symptoms, and all other reviewed systems are negative.How likely are you to doze in the following situations: 0 = not likely, 1 = slight chance, 2 = moderate chance, 3 = high chance  Sitting and Reading? 2 Watching Television? 2 Sitting inactive in a public place (theater  or meeting)? 2 As a passenger in a car for an hour without a break? 2 Lying down in the afternoon when circumstances permit? 2 Sitting and talking to someone? 2 Sitting quietly after lunch without alcohol?2 In a car, while stopped for a few minutes in traffic? 2   Total = 16, Fatigue severity score 31  , depression score 3/ 15   Endorsed snoring, nocturia, not cataplexy, not vivid dreams.    Social History   Socioeconomic History  . Marital status: Legally Separated    Spouse name: Not on file  . Number of children: Not on file  . Years of education: Not on file  . Highest education level: Not on file  Occupational History  . Not on file  Social Needs  . Financial resource strain: Not on file  . Food insecurity:    Worry: Not on file    Inability: Not on file  . Transportation needs:    Medical: Not on file    Non-medical: Not on file  Tobacco Use  . Smoking status: Never Smoker  . Smokeless tobacco: Never Used  Substance and Sexual Activity  . Alcohol use: Yes    Alcohol/week: 1.0 oz    Types: 2 Standard drinks or equivalent per week  . Drug use: Not on file  . Sexual activity: Not on file  Lifestyle  . Physical activity:    Days per week: Not on file    Minutes per session: Not on file  . Stress: Not on file  Relationships  . Social connections:    Talks on phone: Not on file    Gets together: Not on file    Attends religious service: Not on file    Active member of club or organization: Not on file    Attends meetings of clubs or organizations: Not on file    Relationship status: Not on file  . Intimate partner violence:    Fear of current or ex partner: Not on file    Emotionally abused: Not on file    Physically abused: Not on file    Forced sexual activity: Not on file  Other Topics Concern  . Not on file  Social History Narrative  . Not on file    Family History  Problem Relation Age of Onset  . Heart attack Mother   . CVA Mother   .  Hypertension Father   . Heart attack Brother     Past Medical History:  Diagnosis Date  . Chronic kidney disease    kidney stones 2002,2006  . Diabetes mellitus without complication ([place])   . Elevated PSA   . GERD (gastroesophageal reflux disease)   . Gout   . Hypercholesteremia   . Hypertension   . Macular degeneration     Past  Surgical History:  Procedure Laterality Date  . KIDNEY STONE SURGERY    . RIGHT COLECTOMY  05/07/2006    Current Outpatient Medications  Medication Sig Dispense Refill  . allopurinol (ZYLOPRIM) 300 MG tablet Take 300 mg by mouth daily.    Marland Kitchen amLODipine-olmesartan (AZOR) 5-40 MG per tablet Take 1 tablet by mouth daily.    Marland Kitchen aspirin 81 MG tablet Take 81 mg by mouth daily.    . celecoxib (CELEBREX) 200 MG capsule Take 200 mg by mouth daily as needed.    Marland Kitchen esomeprazole (NEXIUM) 40 MG capsule Take 40 mg by mouth daily before breakfast.    . LORazepam (ATIVAN) 0.5 MG tablet Take 0.5 mg by mouth 3 times/day as needed-between meals & bedtime for anxiety.    . metFORMIN (GLUCOPHAGE-XR) 500 MG 24 hr tablet Take 1 tablet by mouth 2 (two) times daily.  3  . nebivolol (BYSTOLIC) 10 MG tablet Take 20 mg by mouth daily.    . pravastatin (PRAVACHOL) 80 MG tablet Take 80 mg by mouth daily.     No current facility-administered medications for this visit.     Allergies as of 07/09/2017  . (No Known Allergies)    Vitals: BP (!) 146/77   Pulse 62   Ht 5\' 7"  (1.702 m)   Wt 255 lb (115.7 kg)   BMI 39.94 kg/m  Last Weight:  Wt Readings from Last 1 Encounters:  07/09/17 255 lb (115.7 kg)   KPT:WSFK mass index is 39.94 kg/m.     Last Height:   Ht Readings from Last 1 Encounters:  07/09/17 5\' 7"  (1.702 m)    Physical exam:  General: The patient is awake, alert and appears not in acute distress. The patient is well groomed. Head: Normocephalic, atraumatic. Neck is supple. Mallampati 5,  neck circumference: 20. 5 ". Nasal airflow patent , Retrognathia is seen.   Cardiovascular:  Regular rate and rhythm , without  murmurs or carotid bruit, and without distended neck veins. Respiratory: Lungs are clear to auscultation. Skin:  Without evidence of edema, or rash Trunk: BMI is elevated . The patient's posture is stooped.   Neurologic exam : The patient is awake and alert, oriented to place and time.   Memory subjective described as intact. Attention span & concentration ability appears normal.  Speech is fluent,  without dysarthria, dysphonia or aphasia.  Mood and affect are appropriate.  Cranial nerves: Pupils are equal and briskly reactive to light.  Funduscopic exam with evidence of cotton wool, laser scars, left eye retinal detachment. . Extraocular movements  in vertical and horizontal planes intact and without nystagmus. Visual fields by finger perimetry are intact. Hearing to finger rub intact. Facial sensation intact to fine touch. Facial motor strength is symmetric and tongue and uvula move midline. Shoulder shrug was symmetrical.  Motor exam:  Elevated tone, normal muscle bulk and symmetric strength in all extremities.  Sensory:  Fine touch, pinprick and vibration decreased in both feet, pin and needles.  Coordination:  Finger-to-nose maneuver  normal without evidence of ataxia, dysmetria or tremor. Gait and station: Patient walks without assistive device.  Deep tendon reflexes: in the upper and lower extremities are symmetric and intact.    Assessment:  After physical and neurologic examination, review of laboratory studies,  Personal review of imaging studies, reports of other /same  Imaging studies, results of polysomnography and / or neurophysiology testing and pre-existing records as far as provided in visit., my assessment is   1) Mr.  Marcellino Fidalgo is a 67 year old right-handed male patient of Dr. Lambert Mody, presenting today with excessive daytime sleepiness endorsing the Epworth sleepiness score at 16 points.  He does have multiple  comorbidities including a BMI of 42, hypertension, diabetes, nocturia related to either diabetes or prostate changes, leg cramps and dysesthesias, and anxiety all afflicting his sleep continuity.  But he describes himself as sleepy other than the average person, he also states that his sleep is rarely sustained and restful.  He wakes up every 2 hours for bathroom breaks, he has not himself awake.  There is no reason to assume that his apnea would not be reduced in comparison to studies 15 years ago.  I would need for the patient to undergo a new sleep study  But I also worry that his past experience with CPAP may have left him apprehensive about using it again.   2) I explained his risk factors for stroke, CAD and MI and atrial fibrillation. I need him to commit to a better sleep hygiene,  If indicated, CPAP use - and diet.   The patient was advised of the nature of the diagnosed disorder , the treatment options and the  risks for general health and wellness arising from not treating the condition.   I spent more than 45  minutes of face to face time with the patient.  Greater than 50% of time was spent in counseling and coordination of care. We have discussed the diagnosis and differential and I answered the patient's questions.    Plan:  Treatment plan and additional workup :  Attended Split night study. Split at [place] 3% , AHI 20.  Use a small mask.     Larey Seat, MD 0/05/5407, 8:11 AM  Certified in Neurology by ABPN Certified in [place] by [facility] [address], [place], [place] [address]

## 2017-07-09 NOTE — Patient Instructions (Signed)

## 2017-07-25 ENCOUNTER — Ambulatory Visit (INDEPENDENT_AMBULATORY_CARE_PROVIDER_SITE_OTHER): Payer: Medicare Other | Admitting: Neurology

## 2017-07-25 DIAGNOSIS — I150 Renovascular hypertension: Secondary | ICD-10-CM

## 2017-07-25 DIAGNOSIS — N182 Chronic kidney disease, stage 2 (mild): Secondary | ICD-10-CM

## 2017-07-25 DIAGNOSIS — G4733 Obstructive sleep apnea (adult) (pediatric): Secondary | ICD-10-CM

## 2017-07-25 DIAGNOSIS — Z72821 Inadequate sleep hygiene: Secondary | ICD-10-CM

## 2017-07-25 DIAGNOSIS — G4700 Insomnia, unspecified: Secondary | ICD-10-CM

## 2017-07-25 DIAGNOSIS — Z9989 Dependence on other enabling machines and devices: Secondary | ICD-10-CM

## 2017-07-26 NOTE — Procedures (Signed)
PATIENT'S NAME:  Joshua, Roberts DOB:      [birth year]      MR#:    [id number]     DATE OF RECORDING: 07/25/2017 REFERRING M.D.:  Deland Pretty, M.D. Study Performed:  Split-Night Titration Study HISTORY:  Joshua Roberts is a 67 y.o. male patient referred by Dr. Shelia Media for evaluation of EDS, excessive daytime sleepiness.    He is a diabetic, obese man, arthritis and gout afflicted male patient and was in the past diagnosed with obstructive sleep apnea but used CPAP inconsistently - his last PSG test was in 2003.  In the meantime he has progressed to hypertensive renal disease, retinopathy, macular degeneration, hypertension and GERD.  He has also high triglycerides low HDL and his last HbA1c has been elevated.  PSA has been elevated.  The last labs that could reflect on all from 06 February 2017. 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   He had no recent major hospitalizations, accidents or surgeries. PSG in 2001 revealed AHI of 72/h.   The patient endorsed the Epworth Sleepiness Scale at 16/24 points, FSS at    The patient's weight 255 pounds with a height of 67 (inches), resulting in a BMI of 40.1 kg/m2. The patient's neck circumference measured 20.5 inches.  CURRENT MEDICATIONS: Zyloprim, Azor, Aspirin, Celebrex, Nexium, Ativan, Glucophage, Bystolic, Pravachol  PROCEDURE:  This is a multichannel digital polysomnogram utilizing the Somnostar 11.2 system.  Electrodes and sensors were applied and monitored per AASM Specifications.   EEG, EOG, Chin and Limb EMG, were sampled at 200 Hz.  ECG, Snore and Nasal Pressure, Thermal Airflow, Respiratory Effort, CPAP Flow and Pressure, Oximetry was sampled at 50 Hz. Digital video and audio were recorded.      BASELINE STUDY WITHOUT CPAP RESULTS: Lights Out was at 21:05 and Lights On at 05:00.  Total recording time (TRT) was 147, with a total sleep time (TST) of 124.5 minutes.   The patient's sleep latency was 25 minutes.  REM void  sleep.  The sleep efficiency was 84.7 %.  SLEEP ARCHITECTURE: WASO (Wake after sleep onset) was 7 minutes, Stage N1 was 11 minutes, Stage N2 was 113.5 minutes, Stage N3 was 0 minutes and Stage R (REM sleep) was 0 minutes.  The percentages were Stage N1 8.8%, Stage N2 91.2%, Stage N3 0% and Stage R (REM sleep) 0%.  RESPIRATORY ANALYSIS:  There were a total of 155 respiratory events:  122 obstructive apneas, 0 central apneas and 0 mixed apneas with a total of 122 apneas and an apnea index (AI) of 58.8. There were 33 hypopneas with a hypopnea index of 15.9. The patient also had 0 respiratory event related arousals (RERAs).  Snoring was noted.     The total APNEA/HYPOPNEA INDEX (AHI) was 74.7 /hour and the total RESPIRATORY DISTURBANCE INDEX was 74.7 /hour.  0 events occurred in REM sleep and 66 events in NREM. The REM AHI was 0, /hour versus a non-REM AHI of 74.7 /hour. The patient spent 225 minutes sleep time in the supine position 215 minutes in non-supine. The supine AHI was 76.9 /hour versus a non-supine AHI of 66.6 /hour.  OXYGEN SATURATION & C02:  The wake baseline 02 saturation was 96%, with the lowest being 73%. Time spent below 89% saturation equaled 24 minutes.  PERIODIC LIMB MOVEMENTS: The patient had a total of 0 Periodic Limb Movements The arousals were noted as: 18 were spontaneous, 0 were associated with PLMs, and 124 were associated  with respiratory events. Audio and video analysis did not show any abnormal or unusual movements, behaviors, phonations or vocalizations. Snoring was noted EKG was in normal sinus rhythm (NSR)  TITRATION STUDY WITH CPAP RESULTS: CPAP was initiated at 5 cmH20 with heated humidity per AASM split night standards and pressure was advanced to 11 cmH20 because of hypopneas, apneas and desaturations.  At a PAP pressure of 11 cmH20, there was a reduction of the AHI to 8.0 /hour.   Total recording time (TRT) was 328 minutes, with a total sleep time (TST) of 315  minutes. The patient's sleep latency was 6.5 minutes. REM latency was 27 minutes.  The sleep efficiency was 96.1 %.    SLEEP ARCHITECTURE: Wake after sleep was 6.5 minutes, Stage N1 8.5 minutes, Stage N2 165.5 minutes, Stage N3 0 minutes and Stage R (REM sleep) 141 minutes. The percentages were: Stage N1 2.7%, Stage N2 52.5%, Stage N3 0% and Stage R (REM sleep) 44.8%.   RESPIRATORY ANALYSIS:  There were a total of 87 respiratory events: 56 obstructive apneas, 1 central apnea and 0 mixed apneas with a total of 57 apneas and an apnea index (AI) of 10.9. There were 30 hypopneas with a hypopnea index of 5.7 /hour. The patient also had 0 respiratory event related arousals (RERAs).     The total APNEA/HYPOPNEA INDEX (AHI) was 16.6 /hour and the total RESPIRATORY DISTURBANCE INDEX was 16.6 /hour. 19 events occurred in REM sleep and 68 events in NREM. The REM AHI was 8.1 /hour versus a non-REM AHI of 23.4 /hour. The patient spent 40% of total sleep time in the supine position. The supine AHI was 21.2 /hour, versus a non-supine AHI of 13.4 /hour.  OXYGEN SATURATION & C02:  The wake baseline 02 saturation was 94%, with the lowest being 85%. Time spent below 89% saturation equaled 1 minute.  PERIODIC LIMB MOVEMENTS:   The patient had a total of 0 Periodic Limb Movements. The arousals were noted as: 12 were spontaneous, 0 were associated with PLMs, and 18 were associated with respiratory events.  POLYSOMNOGRAPHY IMPRESSION :   1. Severe Obstructive Sleep Apnea (OSA), slightly accentuated by supine sleep. AHI of 74.7/h. 2. Hypoxemia with SpO2 nadir 74% and 24 minutes total desaturation time.   3. Primary Snoring.   RECOMMENDATIONS: All above named conditions improved under CPAP therapy but the titration was incomplete.  The last explored CPAP pressure of 11 cm water allowed for a residual AHI of 8/h.  I will order an auto-titration capable CPAP device with a pressure window from 7 cm through 14 cm pressure,  with 2 cm EPR, and heated humidity. The patient was fitted with a large ESON nasal mask by [employer]. Post-study, the patient indicated that sleep was sounder and more restorative than usual.  A follow up appointment will be scheduled in the Sleep Clinic at [facility].      I certify that I have reviewed the entire raw data recording prior to the issuance of this report in accordance with the Standards of Accreditation of the American Academy of Sleep Medicine (AASM)      Larey Seat, M.D.  07-26-2017  Diplomat, American Board of Psychiatry and Neurology  Diplomat, [place] of Sleep Medicine Medical Director, [place] [facility]

## 2017-07-26 NOTE — Addendum Note (Signed)
Addended by: Larey Seat on: 07/26/2017 03:45 PM   Modules accepted: Orders

## 2017-07-29 DIAGNOSIS — C61 Malignant neoplasm of prostate: Secondary | ICD-10-CM | POA: Diagnosis not present

## 2017-07-30 ENCOUNTER — Telehealth: Payer: Self-pay

## 2017-07-30 DIAGNOSIS — N183 Chronic kidney disease, stage 3 (moderate): Secondary | ICD-10-CM | POA: Diagnosis not present

## 2017-07-30 DIAGNOSIS — I129 Hypertensive chronic kidney disease with stage 1 through stage 4 chronic kidney disease, or unspecified chronic kidney disease: Secondary | ICD-10-CM | POA: Diagnosis not present

## 2017-07-30 DIAGNOSIS — N209 Urinary calculus, unspecified: Secondary | ICD-10-CM | POA: Diagnosis not present

## 2017-07-30 NOTE — Telephone Encounter (Signed)
-----   Message from Larey Seat, MD sent at 07/26/2017  3:45 PM EDT ----- PATIENT'S NAME:  Joshua Roberts DOB:      1950-07-24      MR#:    [id number]     DATE OF RECORDING: 07/25/2017 REFERRING M.D.:  Deland Pretty, M.D. Study Performed:  Split-Night Titration Study HISTORY:  CORRADO HYMON is a 67 y.o. male patient referred by Dr. Shelia Media for evaluation of EDS, excessive daytime sleepiness.     POLYSOMNOGRAPHY IMPRESSION :   1. Severe Obstructive Sleep Apnea (OSA), slightly accentuated by supine sleep. AHI of 74.7/h. 2. Hypoxemia with SpO2 nadir 74% and 24 minutes total desaturation time.   3. Primary Snoring.   RECOMMENDATIONS: All above named conditions improved under CPAP therapy but the titration was incomplete.  The last explored CPAP pressure of 11 cm water allowed for a residual AHI of 8/h.  I will order an auto-titration capable CPAP device with a pressure window from 7 cm through 14 cm pressure, with 2 cm EPR, and heated humidity. The patient was fitted with a large ESON nasal mask by [employer]. Post-study, the patient indicated that sleep was sounder and more restorative than usual. A follow up appointment will be scheduled in the Sleep Clinic at [facility].      I certify that I have reviewed the entire raw data recording prior to the issuance of this report in accordance with the Standards of Accreditation of the American Academy of Sleep Medicine (AASM)      Larey Seat, M.D.  07-26-2017  Diplomat, American Board of Psychiatry and Neurology  Diplomat, [place] of Sleep Medicine Medical Director, [place] [facility]

## 2017-07-30 NOTE — Telephone Encounter (Signed)
I called pt. I advised pt that Dr. Brett Fairy reviewed their sleep study results and found that pt has severe osa with hypoxemia. Dr. Brett Fairy recommends that pt start a cpap at home. I reviewed PAP compliance expectations with the pt. Pt is agreeable to starting a CPAP. I advised pt that an order will be sent to a DME, [facility], and [facility] will call the pt within about one week after they file with the pt's insurance. [facility] will show the pt how to use the machine, fit for masks, and troubleshoot the CPAP if needed. A follow up appt was made for insurance purposes with Hoyle Sauer, NP on on Thursday, October 17th, 2019 at 8:45am. Pt verbalized understanding to arrive 15 minutes early and bring their CPAP. A letter with all of this information in it will be mailed to the pt as a reminder. I verified with the pt that the address we have on file is correct. Pt verbalized understanding of results. Pt had no questions at this time but was encouraged to call back if questions arise.

## 2017-07-30 NOTE — Telephone Encounter (Signed)
I called pt to discuss his sleep study results. No answer, left a message asking him to call me back. 

## 2017-08-02 ENCOUNTER — Other Ambulatory Visit: Payer: Self-pay | Admitting: Nephrology

## 2017-08-02 DIAGNOSIS — N183 Chronic kidney disease, stage 3 unspecified: Secondary | ICD-10-CM

## 2017-08-15 ENCOUNTER — Ambulatory Visit
Admission: RE | Admit: 2017-08-15 | Discharge: 2017-08-15 | Disposition: A | Discharge status: Home / Self Care | Payer: Medicare Other | Source: Ambulatory Visit | Attending: Nephrology | Admitting: Nephrology

## 2017-08-15 DIAGNOSIS — N189 Chronic kidney disease, unspecified: Secondary | ICD-10-CM | POA: Diagnosis not present

## 2017-08-15 DIAGNOSIS — N183 Chronic kidney disease, stage 3 unspecified: Secondary | ICD-10-CM

## 2017-09-09 DIAGNOSIS — E119 Type 2 diabetes mellitus without complications: Secondary | ICD-10-CM | POA: Diagnosis not present

## 2017-09-11 DIAGNOSIS — E538 Deficiency of other specified B group vitamins: Secondary | ICD-10-CM | POA: Diagnosis not present

## 2017-09-11 DIAGNOSIS — E11319 Type 2 diabetes mellitus with unspecified diabetic retinopathy without macular edema: Secondary | ICD-10-CM | POA: Diagnosis not present

## 2017-09-17 DIAGNOSIS — E11319 Type 2 diabetes mellitus with unspecified diabetic retinopathy without macular edema: Secondary | ICD-10-CM | POA: Diagnosis not present

## 2017-09-17 DIAGNOSIS — I10 Essential (primary) hypertension: Secondary | ICD-10-CM | POA: Diagnosis not present

## 2017-10-02 DIAGNOSIS — N209 Urinary calculus, unspecified: Secondary | ICD-10-CM | POA: Diagnosis not present

## 2017-10-02 DIAGNOSIS — N183 Chronic kidney disease, stage 3 (moderate): Secondary | ICD-10-CM | POA: Diagnosis not present

## 2017-10-02 DIAGNOSIS — I129 Hypertensive chronic kidney disease with stage 1 through stage 4 chronic kidney disease, or unspecified chronic kidney disease: Secondary | ICD-10-CM | POA: Diagnosis not present

## 2017-10-15 DIAGNOSIS — H40013 Open angle with borderline findings, low risk, bilateral: Secondary | ICD-10-CM | POA: Diagnosis not present

## 2017-10-15 DIAGNOSIS — H2513 Age-related nuclear cataract, bilateral: Secondary | ICD-10-CM | POA: Diagnosis not present

## 2017-10-15 DIAGNOSIS — H25013 Cortical age-related cataract, bilateral: Secondary | ICD-10-CM | POA: Diagnosis not present

## 2017-10-15 DIAGNOSIS — H25042 Posterior subcapsular polar age-related cataract, left eye: Secondary | ICD-10-CM | POA: Diagnosis not present

## 2017-10-30 ENCOUNTER — Other Ambulatory Visit: Payer: Self-pay | Admitting: Urology

## 2017-10-30 DIAGNOSIS — C61 Malignant neoplasm of prostate: Secondary | ICD-10-CM

## 2017-11-21 ENCOUNTER — Ambulatory Visit: Payer: Self-pay | Admitting: Nurse Practitioner

## 2017-12-05 DIAGNOSIS — E119 Type 2 diabetes mellitus without complications: Secondary | ICD-10-CM | POA: Diagnosis not present

## 2017-12-05 DIAGNOSIS — Z23 Encounter for immunization: Secondary | ICD-10-CM | POA: Diagnosis not present

## 2017-12-05 DIAGNOSIS — E538 Deficiency of other specified B group vitamins: Secondary | ICD-10-CM | POA: Diagnosis not present

## 2017-12-16 DIAGNOSIS — E118 Type 2 diabetes mellitus with unspecified complications: Secondary | ICD-10-CM | POA: Diagnosis not present

## 2018-01-06 ENCOUNTER — Inpatient Hospital Stay
Admission: RE | Admit: 2018-01-06 | Discharge: 2018-01-06 | Disposition: A | Discharge status: Home / Self Care | Payer: Medicare Other | Source: Ambulatory Visit | Attending: Urology | Admitting: Urology

## 2018-02-07 ENCOUNTER — Ambulatory Visit
Admission: RE | Admit: 2018-02-07 | Discharge: 2018-02-07 | Disposition: A | Discharge status: Home / Self Care | Payer: Medicare Other | Source: Ambulatory Visit | Attending: Urology | Admitting: Urology

## 2018-02-07 DIAGNOSIS — C61 Malignant neoplasm of prostate: Secondary | ICD-10-CM

## 2018-02-07 MED ORDER — GADOBENATE DIMEGLUMINE 529 MG/ML IV SOLN
20.0000 mL | Freq: Once | INTRAVENOUS | Status: AC | PRN
  Administered 2018-02-07: 20 mL via INTRAVENOUS

## 2018-02-11 ENCOUNTER — Ambulatory Visit: Payer: Medicare Other | Attending: Urology | Admitting: Urology

## 2018-02-13 DIAGNOSIS — E7800 Pure hypercholesterolemia, unspecified: Secondary | ICD-10-CM | POA: Diagnosis not present

## 2018-02-13 DIAGNOSIS — E118 Type 2 diabetes mellitus with unspecified complications: Secondary | ICD-10-CM | POA: Diagnosis not present

## 2018-02-13 DIAGNOSIS — I10 Essential (primary) hypertension: Secondary | ICD-10-CM | POA: Diagnosis not present

## 2018-02-13 DIAGNOSIS — N390 Urinary tract infection, site not specified: Secondary | ICD-10-CM | POA: Diagnosis not present

## 2018-02-17 DIAGNOSIS — Z0001 Encounter for general adult medical examination with abnormal findings: Secondary | ICD-10-CM | POA: Diagnosis not present

## 2018-02-17 DIAGNOSIS — H35039 Hypertensive retinopathy, unspecified eye: Secondary | ICD-10-CM | POA: Diagnosis not present

## 2018-02-17 DIAGNOSIS — I10 Essential (primary) hypertension: Secondary | ICD-10-CM | POA: Diagnosis not present

## 2018-03-25 DIAGNOSIS — E1122 Type 2 diabetes mellitus with diabetic chronic kidney disease: Secondary | ICD-10-CM | POA: Diagnosis not present

## 2018-03-25 DIAGNOSIS — M109 Gout, unspecified: Secondary | ICD-10-CM | POA: Diagnosis not present

## 2018-03-25 DIAGNOSIS — I129 Hypertensive chronic kidney disease with stage 1 through stage 4 chronic kidney disease, or unspecified chronic kidney disease: Secondary | ICD-10-CM | POA: Diagnosis not present

## 2018-03-25 DIAGNOSIS — N183 Chronic kidney disease, stage 3 (moderate): Secondary | ICD-10-CM | POA: Diagnosis not present

## 2018-04-17 DIAGNOSIS — H25042 Posterior subcapsular polar age-related cataract, left eye: Secondary | ICD-10-CM | POA: Diagnosis not present

## 2018-04-17 DIAGNOSIS — H40013 Open angle with borderline findings, low risk, bilateral: Secondary | ICD-10-CM | POA: Diagnosis not present

## 2018-04-17 DIAGNOSIS — H25013 Cortical age-related cataract, bilateral: Secondary | ICD-10-CM | POA: Diagnosis not present

## 2018-04-17 DIAGNOSIS — H2513 Age-related nuclear cataract, bilateral: Secondary | ICD-10-CM | POA: Diagnosis not present

## 2018-05-13 DIAGNOSIS — E119 Type 2 diabetes mellitus without complications: Secondary | ICD-10-CM | POA: Diagnosis not present

## 2018-05-13 DIAGNOSIS — E538 Deficiency of other specified B group vitamins: Secondary | ICD-10-CM | POA: Diagnosis not present

## 2018-05-22 DIAGNOSIS — E782 Mixed hyperlipidemia: Secondary | ICD-10-CM | POA: Diagnosis not present

## 2018-05-22 DIAGNOSIS — E1165 Type 2 diabetes mellitus with hyperglycemia: Secondary | ICD-10-CM | POA: Diagnosis not present

## 2018-05-22 DIAGNOSIS — E11319 Type 2 diabetes mellitus with unspecified diabetic retinopathy without macular edema: Secondary | ICD-10-CM | POA: Diagnosis not present

## 2018-05-22 DIAGNOSIS — I10 Essential (primary) hypertension: Secondary | ICD-10-CM | POA: Diagnosis not present

## 2018-06-10 DIAGNOSIS — I10 Essential (primary) hypertension: Secondary | ICD-10-CM | POA: Diagnosis not present

## 2018-06-10 DIAGNOSIS — E1165 Type 2 diabetes mellitus with hyperglycemia: Secondary | ICD-10-CM | POA: Diagnosis not present

## 2018-06-10 DIAGNOSIS — E782 Mixed hyperlipidemia: Secondary | ICD-10-CM | POA: Diagnosis not present

## 2018-07-08 DIAGNOSIS — E1165 Type 2 diabetes mellitus with hyperglycemia: Secondary | ICD-10-CM | POA: Diagnosis not present

## 2018-07-08 DIAGNOSIS — E782 Mixed hyperlipidemia: Secondary | ICD-10-CM | POA: Diagnosis not present

## 2018-07-08 DIAGNOSIS — I10 Essential (primary) hypertension: Secondary | ICD-10-CM | POA: Diagnosis not present

## 2018-07-09 DIAGNOSIS — H25013 Cortical age-related cataract, bilateral: Secondary | ICD-10-CM | POA: Diagnosis not present

## 2018-07-09 DIAGNOSIS — E119 Type 2 diabetes mellitus without complications: Secondary | ICD-10-CM | POA: Diagnosis not present

## 2018-07-09 DIAGNOSIS — H35033 Hypertensive retinopathy, bilateral: Secondary | ICD-10-CM | POA: Diagnosis not present

## 2018-07-09 DIAGNOSIS — H2513 Age-related nuclear cataract, bilateral: Secondary | ICD-10-CM | POA: Diagnosis not present

## 2018-07-09 DIAGNOSIS — H348322 Tributary (branch) retinal vein occlusion, left eye, stable: Secondary | ICD-10-CM | POA: Diagnosis not present

## 2018-07-09 DIAGNOSIS — H2512 Age-related nuclear cataract, left eye: Secondary | ICD-10-CM | POA: Diagnosis not present

## 2018-08-11 DIAGNOSIS — E782 Mixed hyperlipidemia: Secondary | ICD-10-CM | POA: Diagnosis not present

## 2018-08-11 DIAGNOSIS — I10 Essential (primary) hypertension: Secondary | ICD-10-CM | POA: Diagnosis not present

## 2018-08-11 DIAGNOSIS — E1121 Type 2 diabetes mellitus with diabetic nephropathy: Secondary | ICD-10-CM | POA: Diagnosis not present

## 2018-09-09 DIAGNOSIS — H25012 Cortical age-related cataract, left eye: Secondary | ICD-10-CM | POA: Diagnosis not present

## 2018-09-09 DIAGNOSIS — H2512 Age-related nuclear cataract, left eye: Secondary | ICD-10-CM | POA: Diagnosis not present

## 2018-09-25 DIAGNOSIS — N189 Chronic kidney disease, unspecified: Secondary | ICD-10-CM | POA: Diagnosis not present

## 2018-09-25 DIAGNOSIS — D631 Anemia in chronic kidney disease: Secondary | ICD-10-CM | POA: Diagnosis not present

## 2018-09-25 DIAGNOSIS — E1122 Type 2 diabetes mellitus with diabetic chronic kidney disease: Secondary | ICD-10-CM | POA: Diagnosis not present

## 2018-09-25 DIAGNOSIS — M109 Gout, unspecified: Secondary | ICD-10-CM | POA: Diagnosis not present

## 2018-09-25 DIAGNOSIS — I129 Hypertensive chronic kidney disease with stage 1 through stage 4 chronic kidney disease, or unspecified chronic kidney disease: Secondary | ICD-10-CM | POA: Diagnosis not present

## 2018-09-25 DIAGNOSIS — N183 Chronic kidney disease, stage 3 (moderate): Secondary | ICD-10-CM | POA: Diagnosis not present

## 2018-10-17 DIAGNOSIS — Z23 Encounter for immunization: Secondary | ICD-10-CM | POA: Diagnosis not present

## 2018-11-06 DIAGNOSIS — E118 Type 2 diabetes mellitus with unspecified complications: Secondary | ICD-10-CM | POA: Diagnosis not present

## 2018-11-06 DIAGNOSIS — I10 Essential (primary) hypertension: Secondary | ICD-10-CM | POA: Diagnosis not present

## 2018-11-06 DIAGNOSIS — E782 Mixed hyperlipidemia: Secondary | ICD-10-CM | POA: Diagnosis not present

## 2018-11-06 DIAGNOSIS — E538 Deficiency of other specified B group vitamins: Secondary | ICD-10-CM | POA: Diagnosis not present

## 2018-11-11 DIAGNOSIS — I10 Essential (primary) hypertension: Secondary | ICD-10-CM | POA: Diagnosis not present

## 2018-11-11 DIAGNOSIS — E1121 Type 2 diabetes mellitus with diabetic nephropathy: Secondary | ICD-10-CM | POA: Diagnosis not present

## 2018-11-11 DIAGNOSIS — E782 Mixed hyperlipidemia: Secondary | ICD-10-CM | POA: Diagnosis not present

## 2018-12-19 ENCOUNTER — Encounter: Payer: Self-pay | Admitting: *Deleted

## 2018-12-22 ENCOUNTER — Encounter: Payer: Self-pay | Admitting: *Deleted

## 2018-12-22 ENCOUNTER — Encounter: Payer: Self-pay | Admitting: Radiation Oncology

## 2018-12-22 NOTE — Progress Notes (Signed)
GU Location of Tumor / Histology: prostatic adenocarcinoma  If Prostate Cancer, Gleason Score is (4 + 3) and PSA is (5.86) on 12/2017. Prostate volume: 34 mL.   Joshua Roberts had an initial prostate biopsy on 06/2017. Biopsy reveal gleason 3+3 in one core.   12/18/2018 PSA 10.2  Biopsies of prostate (if applicable) revealed: Repeat biopsy done 04/02/2018.   Past/Anticipated interventions by urology, if any: prostate biopsy, active surveillance, prostate biopsy, referral for consideration of brachytherapy (patient most interest in this treatment form)  Past/Anticipated interventions by medical oncology, if any: no  Weight changes, if any: denies  Bowel/Bladder complaints, if any: IPSS 3 with nocturia. SHIM 3 due to inactivity. Denies dysuria, hematuria, leakage or incontinence. Denies any bowel complaints.   Nausea/Vomiting, if any: denies  Pain issues, if any:  denies  SAFETY ISSUES:  Prior radiation? denies  Pacemaker/ICD? denies  Possible current pregnancy? no, male patient  Is the patient on methotrexate? denies  Current Complaints / other details:  68 year old male. Divorced. Retired. Reports he had aunts and uncles with cancer but "they are all deceased." Patient denies knowing what type of cancer they had or if they are on his Mom or Dad's side of the family.

## 2018-12-23 ENCOUNTER — Encounter: Payer: Self-pay | Admitting: Radiation Oncology

## 2018-12-23 ENCOUNTER — Ambulatory Visit
Admission: RE | Admit: 2018-12-23 | Discharge: 2018-12-23 | Disposition: A | Discharge status: Home / Self Care | Payer: Medicare Other | Source: Ambulatory Visit | Attending: Radiation Oncology | Admitting: Radiation Oncology

## 2018-12-23 ENCOUNTER — Other Ambulatory Visit: Payer: Self-pay

## 2018-12-23 VITALS — Ht 68.0 in | Wt 265.0 lb

## 2018-12-23 DIAGNOSIS — C61 Malignant neoplasm of prostate: Secondary | ICD-10-CM

## 2018-12-23 HISTORY — DX: Malignant neoplasm of prostate: C61

## 2018-12-23 NOTE — Progress Notes (Signed)
Radiation Oncology         [phone] ________________________________  Initial outpatient Consultation - Conducted via Telephone due to current COVID-19 concerns for limiting patient exposure  Name: Joshua Roberts MRN: [id number]  Date: 12/23/2018  DOB: [birth year]  [id number], Thayer Jew, MD  Franchot Gallo, MD   REFERRING PHYSICIAN: Franchot Gallo, MD  DIAGNOSIS: 68 y.o. gentleman with Stage T1c adenocarcinoma of the prostate with Gleason score of 4+3, and PSA of 10.2.    ICD-10-CM   1. Malignant neoplasm of prostate ([place])  C61     HISTORY OF PRESENT ILLNESS: Joshua Roberts is a 68 y.o. male with a diagnosis of prostate cancer. He was initially noted to have an elevated PSA of 6.52 by his primary care physician, Dr. Shelia Media in 07/2016 .  Accordingly, he was referred for evaluation in urology by Dr. Diona Fanti on 08/24/2016, digital rectal exam was without concerning nodules. Repeat PSA performed at that visit was 5.04 so they opted to monitor the PSA, which further elevated to 6.95 in 02/2017.   His initial biopsy was performed on  06/07/2017.  Pathology showed Gleason 3+3 in 20% of a single left apex core so he elected to proceed with active surveillance.  His PSA remained stable at 6.8 in June 2019 and 5.86 on 01/01/2018. He underwent prostate MRI as part of his active surveillance on 02/07/2018, which showed a PI-RADS 3 lesion in the left posterolateral peripheral zone of the apex with a prostrate volume 37.94 cc.  He underwent MRI fusion biopsy on 04/02/2018 and out of a total of 16 core biopsies, 3 were positive.  The prostate volume was 34 gm.  The maximum Gleason score was 4+3 and this was seen in two cores of the ROI MRI lesion.  Additionally, Gleason 3+4 was seen in the left apex.   There was a delay in follow-up visit to discuss his pathology but he eventually returned to Dr. Diona Fanti for discussion on 12/17/2018. A repeat PSA performed that day showed further elevation at  10.2.  The patient reviewed the biopsy results with his urologist and he has kindly been referred today for discussion of potential radiation treatment options.   PREVIOUS RADIATION THERAPY: No  PAST MEDICAL HISTORY:  Past Medical History:  Diagnosis Date   Chronic kidney disease    kidney stones 2002,2006   Diabetes mellitus without complication (HCC)    Elevated PSA    GERD (gastroesophageal reflux disease)    Gout    Hypercholesteremia    Hypertension    Macular degeneration    Prostate cancer ([place])       PAST SURGICAL HISTORY: Past Surgical History:  Procedure Laterality Date   KIDNEY STONE SURGERY     PROSTATE BIOPSY     RIGHT COLECTOMY  05/07/2006    FAMILY HISTORY:  Family History  Problem Relation Age of Onset   Heart attack Mother    CVA Mother    Hypertension Father    Heart attack Brother     SOCIAL HISTORY:  Social History   Socioeconomic History   Marital status: Divorced    Spouse name: Not on file   Number of children: 0   Years of education: Not on file   Highest education level: Not on file  Occupational History    Comment: not working  Scientist, product/process development strain: Not on file   Food insecurity    Worry: Not on file    Inability: Not on file  Transportation needs    Medical: Not on file    Non-medical: Not on file  Tobacco Use   Smoking status: Never Smoker   Smokeless tobacco: Never Used  Substance and Sexual Activity   Alcohol use: Yes    Alcohol/week: 2.0 standard drinks    Types: 2 Standard drinks or equivalent per week   Drug use: Never   Sexual activity: Yes    Comment: Moderate ED. Takes sildenafil.  Lifestyle   Physical activity    Days per week: Not on file    Minutes per session: Not on file   Stress: Not on file  Relationships   Social connections    Talks on phone: Not on file    Gets together: Not on file    Attends religious service: Not on file    Active member of  club or organization: Not on file    Attends meetings of clubs or organizations: Not on file    Relationship status: Not on file   Intimate partner violence    Fear of current or ex partner: Not on file    Emotionally abused: Not on file    Physically abused: Not on file    Forced sexual activity: Not on file  Other Topics Concern   Not on file  Social History Narrative   Not on file    ALLERGIES: Patient has no known allergies.  MEDICATIONS:  Current Outpatient Medications  Medication Sig Dispense Refill   allopurinol (ZYLOPRIM) 300 MG tablet Take 300 mg by mouth daily.     amLODipine-olmesartan (AZOR) 10-40 MG tablet Take 1 tablet by mouth daily.      aspirin 81 MG tablet Take 81 mg by mouth daily.     BYSTOLIC 20 MG TABS      ketorolac (ACULAR) 0.5 % ophthalmic solution Place 1 drop into the left eye 4 (four) times daily.     metFORMIN (GLUCOPHAGE-XR) 500 MG 24 hr tablet Take 1 tablet by mouth 2 (two) times daily.  3   omeprazole (PRILOSEC) 20 MG capsule Take 20 mg by mouth daily.     ONETOUCH VERIO test strip AS DIRECTED TO CHECK BLOOD GLUCOSE TWICE DAILY. E11.65 IN VITRO 90 DAYS     OZEMPIC, 0.25 OR 0.5 MG/DOSE, 2 MG/1.5ML SOPN SMARTSIG:0.25 Milligram(s) SUB-Q Once a Week     pravastatin (PRAVACHOL) 80 MG tablet Take 80 mg by mouth daily.     LORazepam (ATIVAN) 0.5 MG tablet Take 0.5 mg by mouth 3 times/day as needed-between meals & bedtime for anxiety.     No current facility-administered medications for this encounter.     REVIEW OF SYSTEMS:  On review of systems, the patient reports that he is doing well overall. He denies any chest pain, shortness of breath, cough, fevers, chills, night sweats, unintended weight changes. He denies any bowel disturbances, and denies abdominal pain, nausea or vomiting. He denies any new musculoskeletal or joint aches or pains. His IPSS was 3, indicating mild urinary symptoms. He reports nocturia. His SHIM was 3 due to  inactivity. A complete review of systems is obtained and is otherwise negative.    PHYSICAL EXAM:  Wt Readings from Last 3 Encounters:  12/23/18 265 lb (120.2 kg)  07/09/17 255 lb (115.7 kg)  01/16/14 254 lb 6.6 oz (115.4 kg)   Temp Readings from Last 3 Encounters:  01/16/14 98.2 F (36.8 C) (Oral)  02/18/13 97.9 F (36.6 C) (Oral)   BP Readings from Last 3 Encounters:  07/09/17 (!) 146/77  01/16/14 130/76  02/18/13 116/62   Pulse Readings from Last 3 Encounters:  07/09/17 62  01/16/14 73  02/18/13 73   Pain Assessment Pain Score: 0-No pain/  Unable to assess due to telephone consult visit format.   KPS = 90  100 - Normal; no complaints; no evidence of disease. 90   - Able to carry on normal activity; minor signs or symptoms of disease. 80   - Normal activity with effort; some signs or symptoms of disease. 48   - Cares for self; unable to carry on normal activity or to do active work. 60   - Requires occasional assistance, but is able to care for most of his personal needs. 50   - Requires considerable assistance and frequent medical care. 74   - Disabled; requires special care and assistance. 42   - Severely disabled; hospital admission is indicated although death not imminent. 37   - Very sick; hospital admission necessary; active supportive treatment necessary. 10   - Moribund; fatal processes progressing rapidly. 0     - Dead  Karnofsky DA, Abelmann [place], Craver LS and Burchenal [facility] [phone]) The use of the nitrogen mustards in the palliative treatment of carcinoma: with particular reference to bronchogenic carcinoma Cancer 1 [id number]  LABORATORY DATA:  Lab Results  Component Value Date   WBC 9.6 01/16/2014   HGB 8.7 (L) 01/16/2014   HCT 26.8 (L) 01/16/2014   MCV 81.2 01/16/2014   PLT 255 01/16/2014   Lab Results  Component Value Date   NA 138 01/16/2014   K 3.8 01/16/2014   CL 100 01/16/2014   CO2 25 01/16/2014   Lab Results  Component Value Date   ALT 21  01/15/2014   AST 16 01/15/2014   ALKPHOS 59 01/15/2014   BILITOT <0.2 (L) 01/15/2014     RADIOGRAPHY: No results found.    IMPRESSION/PLAN: This visit was conducted via Telephone to spare the patient unnecessary potential exposure in the healthcare setting during the current COVID-19 pandemic. 1. 68 y.o. gentleman with Stage T1c adenocarcinoma of the prostate with Gleason Score of 4+3, and PSA of 10.2. We discussed the patient's workup and outlined the nature of prostate cancer in this setting. The patient's T stage, Gleason's score, and PSA put him into the unfavorable intermediate risk group. Accordingly, he is eligible for a variety of potential treatment options including brachytherapy, 5.5 weeks of external radiation or prostatectomy.  We discussed the available radiation techniques, and focused on the details and logistics of delivery. We discussed and outlined the risks, benefits, short and long-term effects associated with radiotherapy and compared and contrasted these with prostatectomy. We discussed the role of SpaceOAR in reducing the rectal toxicity associated with radiotherapy.  He was encouraged to ask questions that were answered to his stated satisfaction.  At the end of the conversation, the patient is interested in moving forward with brachytherapy and use of SpaceOAR to reduce rectal toxicity from radiotherapy.  We will share our discussion with Dr. Diona Fanti and move forward with scheduling his CT [facility] planning appointment for early January 2021, at the patient's request, in anticipation of proceeding with treatment shortly thereafter.  The patient will be contacted by Romie Jumper who will be working closely with him to coordinate OR scheduling and pre and post procedure appointments.  We will contact the pharmaceutical rep to ensure that [employer] is available at the time of procedure.  He will have a prostate MRI following his  post-seed CT SIM to confirm appropriate distribution of  the SpaceOAR.  Given current concerns for patient exposure during the COVID-19 pandemic, this encounter was conducted via telephone. The patient was notified in advance and was offered a MyChart meeting to allow for face to face communication but unfortunately reported that he did not have the appropriate resources/technology to support such a visit and instead preferred to proceed with telephone consult. The patient has given verbal consent for this type of encounter. The time spent during this encounter was 60 minutes. The attendants for this meeting include Tyler Pita MD, Ashlyn Bruning PA-C, [place], and patient, Joshua Roberts. During the encounter, Tyler Pita MD, Ashlyn Bruning PA-C, and scribe, Wilburn Mylar were located at [place].  Patient, Joshua Roberts was located at home.    Nicholos Johns, PA-C    Tyler Pita, MD  [facility] Direct Dial: [phone]   Fax: [phone] [email or web address]   Skype   LinkedIn  This document serves as a record of services personally performed by Tyler Pita, MD and Freeman Caldron, PA-C. It was created on their behalf by Wilburn Mylar, a trained medical scribe. The creation of this record is based on the scribe's personal observations and the provider's statements to them. This document has been checked and approved by the attending provider.

## 2018-12-23 NOTE — Progress Notes (Signed)
See progress note under physician encounter. 

## 2018-12-24 ENCOUNTER — Telehealth: Payer: Self-pay | Admitting: *Deleted

## 2018-12-24 NOTE — Telephone Encounter (Signed)
CALLED PATIENT TO ASK QUESTIONS, LVM FOR A RETURN CALL 

## 2019-01-05 ENCOUNTER — Telehealth: Payer: Self-pay | Admitting: Medical Oncology

## 2019-01-05 NOTE — Telephone Encounter (Signed)
Spoke with patient to introduce myself as the prostate nurse navigator and discuss my role. I was unable to meet him 11/17 when he consulted with Dr. Tammi Klippel. He states the consult went very well and he has chosen brachytherapy. He does not have a surgery date but working with Enid Derry to schedule appointments. I discussed our prostate support group and will give him information when he comes for CT simulation. I gave him my contact information and asked him to call me with questions or concerns. He voiced understanding and was very appreciative of the call.

## 2019-01-13 ENCOUNTER — Telehealth: Payer: Self-pay | Admitting: *Deleted

## 2019-01-13 NOTE — Telephone Encounter (Signed)
CALLED PATIENT TO ASK QUESTIONS, SPOKE WITH PATIENT 

## 2019-01-22 ENCOUNTER — Telehealth: Payer: Self-pay | Admitting: Medical Oncology

## 2019-01-22 NOTE — Telephone Encounter (Signed)
Joshua Roberts, expressed his wish to delay brachytherapy until spring of 2021, due to [place]. I spoke with him to acknowledge his fears and shared Dr. York Pellant, concerns with delaying treatment. I discussed his Gleason, PSA and how his cancer had progressed from 2019 to 2020. He does want to delay and is willing to discuss ADT which would treat his cancer while waiting for surgery. I informed him that this injection would be done with Dr. Diona Fanti, He currently does not have a follow up appointment with Dr. Diona Fanti.  I will discuss the above with Ashlyn.

## 2019-02-12 ENCOUNTER — Ambulatory Visit: Payer: Medicare Other | Admitting: Urology

## 2019-02-12 ENCOUNTER — Ambulatory Visit: Payer: Self-pay | Admitting: Urology

## 2019-02-16 ENCOUNTER — Telehealth: Payer: Self-pay | Admitting: Medical Oncology

## 2019-02-16 NOTE — Telephone Encounter (Signed)
Left message for Dr. Diona Fanti, Joshua Roberts, expressed his wish to delay brachytherapy until spring of 2021, due to [place]. I spoke with Ashlyn/Dr. Tammi Klippel and they are concerned with delay in treatment with progression of cancer from 2019-2020.They suggested ST-ADT to cover him until spring. I spoke with patient and he would like to come in and discuss with Dr. Diona Fanti. I asked for a return call

## 2019-02-16 NOTE — Telephone Encounter (Signed)
Left message to inform him of appointment with Dr. Diona Fanti 1/25 @ 10:30 am to discuss ST-ADT. I asked him to call me to confirm.

## 2019-02-16 NOTE — Telephone Encounter (Signed)
[facility] returned my call with an appointment with Dr. Diona Fanti, 1/25 @ 10:30 am. I will contact patient.

## 2019-02-17 ENCOUNTER — Other Ambulatory Visit: Payer: Self-pay

## 2019-02-17 ENCOUNTER — Emergency Department (HOSPITAL_COMMUNITY)
Admission: EM | Admit: 2019-02-17 | Discharge: 2019-02-17 | Disposition: A | Discharge status: Home / Self Care | Payer: Medicare Other | Attending: Emergency Medicine | Admitting: Emergency Medicine

## 2019-02-17 ENCOUNTER — Emergency Department (HOSPITAL_COMMUNITY): Payer: Medicare Other

## 2019-02-17 ENCOUNTER — Encounter (HOSPITAL_COMMUNITY): Payer: Self-pay

## 2019-02-17 DIAGNOSIS — N182 Chronic kidney disease, stage 2 (mild): Secondary | ICD-10-CM | POA: Insufficient documentation

## 2019-02-17 DIAGNOSIS — Z8546 Personal history of malignant neoplasm of prostate: Secondary | ICD-10-CM | POA: Insufficient documentation

## 2019-02-17 DIAGNOSIS — E118 Type 2 diabetes mellitus with unspecified complications: Secondary | ICD-10-CM | POA: Diagnosis not present

## 2019-02-17 DIAGNOSIS — E1122 Type 2 diabetes mellitus with diabetic chronic kidney disease: Secondary | ICD-10-CM | POA: Diagnosis not present

## 2019-02-17 DIAGNOSIS — Z79899 Other long term (current) drug therapy: Secondary | ICD-10-CM | POA: Insufficient documentation

## 2019-02-17 DIAGNOSIS — Z7984 Long term (current) use of oral hypoglycemic drugs: Secondary | ICD-10-CM | POA: Insufficient documentation

## 2019-02-17 DIAGNOSIS — I129 Hypertensive chronic kidney disease with stage 1 through stage 4 chronic kidney disease, or unspecified chronic kidney disease: Secondary | ICD-10-CM | POA: Diagnosis not present

## 2019-02-17 DIAGNOSIS — R55 Syncope and collapse: Secondary | ICD-10-CM | POA: Diagnosis not present

## 2019-02-17 DIAGNOSIS — R5383 Other fatigue: Secondary | ICD-10-CM | POA: Diagnosis not present

## 2019-02-17 DIAGNOSIS — I10 Essential (primary) hypertension: Secondary | ICD-10-CM | POA: Diagnosis not present

## 2019-02-17 LAB — URINALYSIS, ROUTINE W REFLEX MICROSCOPIC
Bilirubin Urine: NEGATIVE
Glucose, UA: NEGATIVE mg/dL
Hgb urine dipstick: NEGATIVE
Ketones, ur: 5 mg/dL — AB
Leukocytes,Ua: NEGATIVE
Nitrite: NEGATIVE
Protein, ur: 100 mg/dL — AB
Specific Gravity, Urine: 1.026 (ref 1.005–1.030)
pH: 5 (ref 5.0–8.0)

## 2019-02-17 LAB — TROPONIN I (HIGH SENSITIVITY): Troponin I (High Sensitivity): 3 ng/L (ref <18)

## 2019-02-17 LAB — CBC
HCT: 42.2 % (ref 39.0–52.0)
Hemoglobin: 13.4 g/dL (ref 13.0–17.0)
MCH: 26.7 pg (ref 26.0–34.0)
MCHC: 31.8 g/dL (ref 30.0–36.0)
MCV: 84.2 fL (ref 80.0–100.0)
Platelets: 320 10*3/uL (ref 150–400)
RBC: 5.01 MIL/uL (ref 4.22–5.81)
RDW: 14.4 % (ref 11.5–15.5)
WBC: 11.5 10*3/uL — ABNORMAL HIGH (ref 4.0–10.5)
nRBC: 0 % (ref 0.0–0.2)

## 2019-02-17 LAB — BASIC METABOLIC PANEL
Anion gap: 9 (ref 5–15)
BUN: 21 mg/dL (ref 8–23)
CO2: 26 mmol/L (ref 22–32)
Calcium: 8.8 mg/dL — ABNORMAL LOW (ref 8.9–10.3)
Chloride: 105 mmol/L (ref 98–111)
Creatinine, Ser: 1.41 mg/dL — ABNORMAL HIGH (ref 0.61–1.24)
GFR calc Af Amer: 60 mL/min — ABNORMAL LOW (ref >60)
GFR calc non Af Amer: 52 mL/min — ABNORMAL LOW (ref >60)
Glucose, Bld: 160 mg/dL — ABNORMAL HIGH (ref 70–99)
Potassium: 3.7 mmol/L (ref 3.5–5.1)
Sodium: 140 mmol/L (ref 135–145)

## 2019-02-17 LAB — CBG MONITORING, ED: Glucose-Capillary: 175 mg/dL — ABNORMAL HIGH (ref 70–99)

## 2019-02-17 LAB — D-DIMER, QUANTITATIVE: D-Dimer, Quant: 0.27 ug/mL-FEU (ref 0.00–0.50)

## 2019-02-17 MED ORDER — SODIUM CHLORIDE 0.9% FLUSH: 3.0000 mL | Freq: Once | INTRAVENOUS | Status: DC

## 2019-02-17 NOTE — ED Provider Notes (Signed)
[facility] DEPT Provider Note   CSN: [id number] Arrival date & time: 02/17/19  1352     History Chief Complaint  Patient presents with  . Near Syncope    Joshua Roberts is a 69 y.o. male.  69 yo M with a chief complaint of feeling like he may pass out.  He has had multiple episodes of this over the past week.  Happened a couple times while he was driving his car.  Had one today as he got up to leave a restaurant.  Usually occurs after he eats sometimes happen when he is driving.  He denies chest pain or shortness of breath with this denies headaches or neck pain.  Denies diaphoresis.  He feels like he has been eating and drinking normally.  Had one episode of diarrhea today but denies ongoing diarrhea.  Denies dark stool or blood in the stool.  Denies abdominal pain.  Patient's been diagnosed with prostate cancer but has not yet started therapy.  He also recently had both of his blood pressure medications increased.  This corresponded just before the start having symptoms.  The history is provided by the patient.  Near Syncope This is a new problem. The current episode started more than 1 week ago. The problem occurs constantly. The problem has not changed since onset.Pertinent negatives include no chest pain, no abdominal pain, no headaches and no shortness of breath. Nothing aggravates the symptoms. Nothing relieves the symptoms. He has tried nothing for the symptoms. The treatment provided no relief.       Past Medical History:  Diagnosis Date  . Chronic kidney disease    kidney stones 2002,2006  . Diabetes mellitus without complication ([place])   . Elevated PSA   . GERD (gastroesophageal reflux disease)   . Gout   . Hypercholesteremia   . Hypertension   . Macular degeneration   . Prostate cancer [facility])     Patient Active Problem List   Diagnosis Date Noted  . Malignant neoplasm of prostate ([place]) 12/23/2018  . OSA on CPAP 07/09/2017  .  Insomnia disorder related to known organic factor 07/09/2017  . Poor sleep hygiene 07/09/2017  . Renovascular hypertension 07/09/2017  . CKD (chronic kidney disease) stage 2, GFR 60-89 ml/min 07/09/2017  . GI bleed 01/14/2014  . GIB (gastrointestinal bleeding) 01/14/2014  . Obstructive sleep apnea:  07/09/2012  . Obesity, Class III, BMI 40-49.9 (morbid obesity) ([place]) 07/09/2012  . Essential hypertension 07/09/2012  . Hyperlipidemia 07/09/2012  . Diabetes mellitus type 2 in obese ([place]) 07/09/2012  . Right bundle branch block 07/09/2012    Past Surgical History:  Procedure Laterality Date  . KIDNEY STONE SURGERY    . PROSTATE BIOPSY    . RIGHT COLECTOMY  05/07/2006       Family History  Problem Relation Age of Onset  . Heart attack Mother   . CVA Mother   . Hypertension Father   . Heart attack Brother     Social History   Tobacco Use  . Smoking status: Never Smoker  . Smokeless tobacco: Never Used  Substance Use Topics  . Alcohol use: Yes    Alcohol/week: 2.0 standard drinks    Types: 2 Standard drinks or equivalent per week  . Drug use: Never    Home Medications Prior to Admission medications   Medication Sig Start Date End Date Taking? Authorizing Provider  allopurinol (ZYLOPRIM) 300 MG tablet Take 300 mg by mouth daily.    [provider]  amLODipine-olmesartan (AZOR) 10-40 MG tablet Take 1 tablet by mouth daily.     [provider]  aspirin 81 MG tablet Take 81 mg by mouth daily.    [provider]  BYSTOLIC 20 MG TABS  [id number]   [provider]  ketorolac (ACULAR) 0.5 % ophthalmic solution Place 1 drop into the left eye 4 (four) times daily. 12/12/18   [provider]  LORazepam (ATIVAN) 0.5 MG tablet Take 0.5 mg by mouth 3 times/day as needed-between meals & bedtime for anxiety.    [provider]  metFORMIN (GLUCOPHAGE-XR) 500 MG 24 hr tablet Take 1 tablet by mouth 2 (two) times daily. 06/20/17   [provider]  omeprazole (PRILOSEC) 20 MG capsule Take 20 mg by mouth daily.    [provider]  ONETOUCH VERIO test strip AS DIRECTED TO CHECK BLOOD GLUCOSE TWICE DAILY. E11.65 IN VITRO 90 DAYS 12/06/18   [provider]  OZEMPIC, 0.25 OR 0.5 MG/DOSE, 2 MG/1.5ML SOPN SMARTSIG:0.25 Milligram(s) SUB-Q Once a Week 10/07/18   [provider]  pravastatin (PRAVACHOL) 80 MG tablet Take 80 mg by mouth daily.    [provider]    Allergies    Patient has no known allergies.  Review of Systems   Review of Systems  Constitutional: Negative for chills and fever.  HENT: Negative for congestion and facial swelling.   Eyes: Negative for discharge and visual disturbance.  Respiratory: Negative for shortness of breath.   Cardiovascular: Positive for near-syncope. Negative for chest pain and palpitations.  Gastrointestinal: Negative for abdominal pain, diarrhea and vomiting.  Musculoskeletal: Negative for arthralgias and myalgias.  Skin: Negative for color change and rash.  Neurological: Positive for syncope (near). Negative for tremors and headaches.  Psychiatric/Behavioral: Negative for confusion and dysphoric mood.    Physical Exam Updated Vital Signs BP (!) 153/87   Pulse 74   Temp 98.7 F (37.1 C) (Oral)   Resp (!) 21   SpO2 99%   Physical Exam Vitals and nursing note reviewed.  Constitutional:      Appearance: He is well-developed.  HENT:     Head: Normocephalic and atraumatic.  Eyes:     Pupils: Pupils are equal, round, and reactive to light.  Neck:     Vascular: No JVD.  Cardiovascular:     Rate and Rhythm: Normal rate and regular rhythm.     Heart sounds: No murmur. No friction rub. No gallop.   Pulmonary:     Effort: No respiratory distress.     Breath sounds: No wheezing.  Abdominal:     General: There is no distension.     Tenderness: There is no abdominal tenderness. There is no guarding or rebound.  Musculoskeletal:         General: Normal range of motion.     Cervical back: Normal range of motion and neck supple.  Skin:    Coloration: Skin is not pale.     Findings: No rash.  Neurological:     Mental Status: He is alert and oriented to person, place, and time.  Psychiatric:        Behavior: Behavior normal.     ED Results / Procedures / Treatments   Labs (all labs ordered are listed, but only abnormal results are displayed) Labs Reviewed  BASIC METABOLIC PANEL - Abnormal; Notable for the following components:      Result Value   Glucose, Bld 160 (*)    Creatinine,  Ser 1.41 (*)    Calcium 8.8 (*)    GFR calc non Af Amer 52 (*)    GFR calc Af Amer 60 (*)    All other components within normal limits  CBC - Abnormal; Notable for the following components:   WBC 11.5 (*)    All other components within normal limits  URINALYSIS, ROUTINE W REFLEX MICROSCOPIC - Abnormal; Notable for the following components:   Ketones, ur 5 (*)    Protein, ur 100 (*)    Bacteria, UA RARE (*)    All other components within normal limits  CBG MONITORING, ED - Abnormal; Notable for the following components:   Glucose-Capillary 175 (*)    All other components within normal limits  D-DIMER, QUANTITATIVE (NOT AT [facility])  CBG MONITORING, ED  TROPONIN I (HIGH SENSITIVITY)    EKG EKG Interpretation  Date/Time:  Tuesday February 17 2019 14:10:05 EST Ventricular Rate:  96 PR Interval:    QRS Duration: 152 QT Interval:  420 QTC Calculation: 531 R Axis:   -91 Text Interpretation: Sinus rhythm RBBB and LAFB No significant change since 12/15 Confirmed by Aletta Edouard [phone]) on 02/17/2019 2:12:52 PM   Radiology DG Chest Port 1 View  Result Date: 02/17/2019 CLINICAL DATA:  Fatigue. EXAM: PORTABLE CHEST 1 VIEW COMPARISON:  February 18, 2013. FINDINGS: Stable cardiomediastinal silhouette. Both lungs are clear. The visualized skeletal structures are unremarkable. IMPRESSION: No active disease. Electronically Signed   By:  Marijo Conception M.D.   On: 02/17/2019 17:31    Procedures Procedures (including critical care time)  Medications Ordered in ED Medications  sodium chloride flush (NS) 0.9 % injection 3 mL (has no administration in time range)    ED Course  I have reviewed the triage vital signs and the nursing notes.  Pertinent labs & imaging results that were available during my care of the patient were reviewed by me and considered in my medical decision making (see chart for details).    MDM Rules/Calculators/A&P                      69 yo M with a chief complaints of a sensation like he may pass out.  Going on for about a week.  Occurs off and on.  Seems to be somewhat positional.  Improved with a bolus of IV fluids.  Ambulates without difficulty.  Timing would suggest that most likely cause would be the increase of his blood pressure medicines.  He seems low risk for a PE, ordered a D-dimer that was negative. He has not anemic.  Renal function is mildly worse than baseline.  Troponins negative.  EKG appears to be similar to prior.  He is mildly hypertensive here.  Suggest that he discuss his blood pressure medicine regiment with his family doctor.    Final Clinical Impression(s) / ED Diagnoses Final diagnoses:  Near syncope    Rx / DC Orders ED Discharge Orders    None       Deno Etienne, DO 02/17/19 1910

## 2019-02-17 NOTE — ED Triage Notes (Signed)
Patient reports feeling like he is going to pass out on and off for a week.   Patient reports going to PCP for physical and blood work today but did not mention to them that he feels like he is going to pass out.   Patient states he has a history of anxiety and panic attacks and has not drove his car in 1 week because it gives him anxiety.    A/ox4 Wheelchair    Hx. DM

## 2019-02-17 NOTE — Discharge Instructions (Signed)
Please return to the emergency department for worsening symptoms or if you start to develop chest pain shortness of breath headaches neck pain or fever.  Return if you have trouble eating drinking.  Please call your family doctor and discuss your recent medication changes and tell them that you have been having the symptoms since the onset and see what they want to do about your blood pressure.

## 2019-02-17 NOTE — ED Notes (Signed)
Ambulate pt in hallway around nurses station..the patient spo2 sats maintained between 99-100%

## 2019-02-19 ENCOUNTER — Telehealth: Payer: Self-pay | Admitting: *Deleted

## 2019-02-19 NOTE — Telephone Encounter (Signed)
CALLED PATIENT TO ASK QUESTION, LVM FOR A RETURN CALL 

## 2019-02-24 ENCOUNTER — Telehealth: Payer: Self-pay | Admitting: *Deleted

## 2019-02-24 DIAGNOSIS — K219 Gastro-esophageal reflux disease without esophagitis: Secondary | ICD-10-CM | POA: Diagnosis not present

## 2019-02-24 DIAGNOSIS — Z0001 Encounter for general adult medical examination with abnormal findings: Secondary | ICD-10-CM | POA: Diagnosis not present

## 2019-02-24 DIAGNOSIS — E11319 Type 2 diabetes mellitus with unspecified diabetic retinopathy without macular edema: Secondary | ICD-10-CM | POA: Diagnosis not present

## 2019-02-24 DIAGNOSIS — I10 Essential (primary) hypertension: Secondary | ICD-10-CM | POA: Diagnosis not present

## 2019-02-24 DIAGNOSIS — G4733 Obstructive sleep apnea (adult) (pediatric): Secondary | ICD-10-CM | POA: Diagnosis not present

## 2019-02-24 NOTE — Telephone Encounter (Signed)
CALLED PATIENT TO ASK IF HE HAS MADE HIS MIND UP ABOUT PROCESSING, PATIENT STATES THAT HE HAS BEEN PUT ON THE SCHEDULE TO TAKE COVID VACCINE

## 2019-03-24 ENCOUNTER — Telehealth: Payer: Self-pay | Admitting: *Deleted

## 2019-03-24 ENCOUNTER — Other Ambulatory Visit: Payer: Self-pay | Admitting: Urology

## 2019-03-24 NOTE — Telephone Encounter (Signed)
Called patient to inform of pre-seed appts. and implant date, spoke with patient and he is aware of these appts. 

## 2019-03-31 DIAGNOSIS — I10 Essential (primary) hypertension: Secondary | ICD-10-CM | POA: Diagnosis not present

## 2019-04-15 ENCOUNTER — Telehealth: Payer: Self-pay | Admitting: *Deleted

## 2019-04-15 DIAGNOSIS — H40013 Open angle with borderline findings, low risk, bilateral: Secondary | ICD-10-CM | POA: Diagnosis not present

## 2019-04-15 NOTE — Telephone Encounter (Signed)
Called patient to remind of pre-seed appts. for 04-16-19, spoke with patient and he is aware of these appts.

## 2019-04-16 ENCOUNTER — Ambulatory Visit: Payer: Medicare Other | Admitting: Urology

## 2019-04-16 ENCOUNTER — Encounter (HOSPITAL_COMMUNITY): Admission: RE | Admit: 2019-04-16 | Payer: Medicare Other | Source: Ambulatory Visit

## 2019-04-16 ENCOUNTER — Encounter (HOSPITAL_COMMUNITY): Payer: Medicare Other

## 2019-04-16 ENCOUNTER — Ambulatory Visit: Payer: Medicare Other | Admitting: Radiation Oncology

## 2019-04-16 ENCOUNTER — Other Ambulatory Visit: Payer: Self-pay | Admitting: Urology

## 2019-04-16 DIAGNOSIS — C61 Malignant neoplasm of prostate: Secondary | ICD-10-CM

## 2019-04-17 ENCOUNTER — Telehealth: Payer: Self-pay | Admitting: *Deleted

## 2019-04-17 NOTE — Telephone Encounter (Signed)
Called patient to inform of pre-seed appts. and MRI for 04-23-19, spoke with patient and he is aware of these appts.

## 2019-04-20 ENCOUNTER — Encounter (HOSPITAL_COMMUNITY): Payer: Medicare Other

## 2019-04-21 DIAGNOSIS — D631 Anemia in chronic kidney disease: Secondary | ICD-10-CM | POA: Diagnosis not present

## 2019-04-21 DIAGNOSIS — E1122 Type 2 diabetes mellitus with diabetic chronic kidney disease: Secondary | ICD-10-CM | POA: Diagnosis not present

## 2019-04-21 DIAGNOSIS — N2581 Secondary hyperparathyroidism of renal origin: Secondary | ICD-10-CM | POA: Diagnosis not present

## 2019-04-21 DIAGNOSIS — I129 Hypertensive chronic kidney disease with stage 1 through stage 4 chronic kidney disease, or unspecified chronic kidney disease: Secondary | ICD-10-CM | POA: Diagnosis not present

## 2019-04-21 DIAGNOSIS — N189 Chronic kidney disease, unspecified: Secondary | ICD-10-CM | POA: Diagnosis not present

## 2019-04-21 DIAGNOSIS — N1831 Chronic kidney disease, stage 3a: Secondary | ICD-10-CM | POA: Diagnosis not present

## 2019-04-22 ENCOUNTER — Telehealth: Payer: Self-pay | Admitting: *Deleted

## 2019-04-22 NOTE — Telephone Encounter (Signed)
Called patient to remind of pre-seed appts. for 04-23-19, no answer, unable to leave message due to vm being full

## 2019-04-23 ENCOUNTER — Ambulatory Visit
Admission: RE | Admit: 2019-04-23 | Discharge: 2019-04-23 | Disposition: A | Discharge status: Home / Self Care | Payer: Medicare Other | Source: Ambulatory Visit | Attending: Radiation Oncology | Admitting: Radiation Oncology

## 2019-04-23 ENCOUNTER — Encounter: Payer: Self-pay | Admitting: Medical Oncology

## 2019-04-23 ENCOUNTER — Other Ambulatory Visit: Payer: Self-pay

## 2019-04-23 ENCOUNTER — Ambulatory Visit
Admission: RE | Admit: 2019-04-23 | Discharge: 2019-04-23 | Disposition: A | Discharge status: Home / Self Care | Payer: Medicare Other | Source: Ambulatory Visit | Attending: Urology | Admitting: Urology

## 2019-04-23 ENCOUNTER — Encounter (HOSPITAL_COMMUNITY)
Admission: RE | Admit: 2019-04-23 | Discharge: 2019-04-23 | Disposition: A | Discharge status: Home / Self Care | Payer: Medicare Other | Source: Ambulatory Visit | Attending: Urology | Admitting: Urology

## 2019-04-23 ENCOUNTER — Ambulatory Visit (HOSPITAL_COMMUNITY)
Admission: RE | Admit: 2019-04-23 | Discharge: 2019-04-23 | Disposition: A | Discharge status: Home / Self Care | Payer: Medicare Other | Source: Ambulatory Visit | Attending: Urology | Admitting: Urology

## 2019-04-23 DIAGNOSIS — Z01818 Encounter for other preprocedural examination: Secondary | ICD-10-CM | POA: Insufficient documentation

## 2019-04-23 DIAGNOSIS — I10 Essential (primary) hypertension: Secondary | ICD-10-CM | POA: Diagnosis not present

## 2019-04-23 DIAGNOSIS — C61 Malignant neoplasm of prostate: Secondary | ICD-10-CM

## 2019-04-23 NOTE — Progress Notes (Signed)
  Radiation Oncology         [phone]) [phone] ________________________________  Name: Joshua Roberts MRN: [id number]  Date: 04/23/2019  DOB: 1950-08-27  SIMULATION AND TREATMENT PLANNING NOTE PUBIC ARCH STUDY  [id number], Thayer Jew, MD  Deland Pretty, MD  DIAGNOSIS:  Oncology History Overview Note  69 y.o. gentleman with Stage T1c adenocarcinoma of the prostate with Gleason score of 4+3, and PSA of 10.2   Malignant neoplasm of prostate ([place])  04/02/2018 Cancer Staging   Staging form: Prostate, AJCC 8th Edition - Clinical stage from 04/02/2018: Stage IIC (cT1c, cN0, cM0, PSA: 10.2, Grade Group: 3) - Signed by Freeman Caldron, PA-C on 04/16/2019   12/23/2018 Initial Diagnosis   Malignant neoplasm of prostate ([place])       ICD-10-CM   1. Malignant neoplasm of prostate ([place])  C61     COMPLEX SIMULATION:  The patient presented today for evaluation for possible prostate seed implant. He was brought to the radiation planning suite and placed supine on the CT couch. A 3-dimensional image study set was obtained in upload to the planning computer. There, on each axial slice, I contoured the prostate gland. Then, using three-dimensional radiation planning tools I reconstructed the prostate in view of the structures from the transperineal needle pathway to assess for possible pubic arch interference. In doing so, I did not appreciate any pubic arch interference. Also, the patient's prostate volume was estimated based on the drawn structure. The volume was 38 cc.  Given the pubic arch appearance and prostate volume, patient remains a good candidate to proceed with prostate seed implant. Today, he freely provided informed written consent to proceed.    PLAN: The patient will undergo prostate seed implant.   ________________________________  Sheral Apley. Tammi Klippel, M.D.

## 2019-05-05 DIAGNOSIS — I10 Essential (primary) hypertension: Secondary | ICD-10-CM | POA: Diagnosis not present

## 2019-05-05 DIAGNOSIS — E782 Mixed hyperlipidemia: Secondary | ICD-10-CM | POA: Diagnosis not present

## 2019-05-05 DIAGNOSIS — E118 Type 2 diabetes mellitus with unspecified complications: Secondary | ICD-10-CM | POA: Diagnosis not present

## 2019-05-13 ENCOUNTER — Encounter (HOSPITAL_BASED_OUTPATIENT_CLINIC_OR_DEPARTMENT_OTHER): Payer: Self-pay | Admitting: Urology

## 2019-05-13 ENCOUNTER — Other Ambulatory Visit: Payer: Self-pay

## 2019-05-13 NOTE — Progress Notes (Signed)
Spoke w/ via phone for pre-op interview---patient Lab needs dos---- none            Lab results------has lab appt for 05-18-2019 at 200 pm for cbc, cmet, pt, ptt COVID test ------05-18-2019 at 100 pm Arrive at -------730 am 05-21-2019 NPO after ------midnight Medications to take morning of surgery -----patient wishes to take no meds am of surgery Diabetic medication -----n/a Patient Special Instructions -----patient stopping 81 mg aspirin 1 week before surgery per dr Diona Fanti instructions Pre-Op special Istructions -----fleets enema am of surgery Patient verbalized understanding of instructions that were given at this phone interview. Patient denies shortness of breath, chest pain, fever, cough a this phone interview.

## 2019-05-14 DIAGNOSIS — E782 Mixed hyperlipidemia: Secondary | ICD-10-CM | POA: Diagnosis not present

## 2019-05-14 DIAGNOSIS — I10 Essential (primary) hypertension: Secondary | ICD-10-CM | POA: Diagnosis not present

## 2019-05-14 DIAGNOSIS — E1121 Type 2 diabetes mellitus with diabetic nephropathy: Secondary | ICD-10-CM | POA: Diagnosis not present

## 2019-05-15 ENCOUNTER — Telehealth: Payer: Self-pay | Admitting: *Deleted

## 2019-05-15 NOTE — Telephone Encounter (Signed)
CALLED PATIENT TO REMIND OF LAB AND COVID TESTING FOR 05-18-19, LVM FOR A RETURN CALL

## 2019-05-18 ENCOUNTER — Other Ambulatory Visit (HOSPITAL_COMMUNITY)
Admission: RE | Admit: 2019-05-18 | Discharge: 2019-05-18 | Disposition: A | Discharge status: Home / Self Care | Payer: Medicare Other | Source: Ambulatory Visit | Attending: Urology | Admitting: Urology

## 2019-05-18 ENCOUNTER — Other Ambulatory Visit: Payer: Self-pay

## 2019-05-18 ENCOUNTER — Encounter (HOSPITAL_COMMUNITY)
Admission: RE | Admit: 2019-05-18 | Discharge: 2019-05-18 | Disposition: A | Discharge status: Home / Self Care | Payer: Medicare Other | Source: Ambulatory Visit | Attending: Urology | Admitting: Urology

## 2019-05-18 DIAGNOSIS — Z20822 Contact with and (suspected) exposure to covid-19: Secondary | ICD-10-CM | POA: Insufficient documentation

## 2019-05-18 DIAGNOSIS — Z01812 Encounter for preprocedural laboratory examination: Secondary | ICD-10-CM | POA: Diagnosis not present

## 2019-05-18 LAB — CBC
HCT: 40.2 % (ref 39.0–52.0)
Hemoglobin: 13.1 g/dL (ref 13.0–17.0)
MCH: 27.2 pg (ref 26.0–34.0)
MCHC: 32.6 g/dL (ref 30.0–36.0)
MCV: 83.4 fL (ref 80.0–100.0)
Platelets: 273 10*3/uL (ref 150–400)
RBC: 4.82 MIL/uL (ref 4.22–5.81)
RDW: 14.3 % (ref 11.5–15.5)
WBC: 10.6 10*3/uL — ABNORMAL HIGH (ref 4.0–10.5)
nRBC: 0 % (ref 0.0–0.2)

## 2019-05-18 LAB — PROTIME-INR
INR: 1.1 (ref 0.8–1.2)
Prothrombin Time: 13.6 seconds (ref 11.4–15.2)

## 2019-05-18 LAB — COMPREHENSIVE METABOLIC PANEL
ALT: 27 U/L (ref 0–44)
AST: 22 U/L (ref 15–41)
Albumin: 3.8 g/dL (ref 3.5–5.0)
Alkaline Phosphatase: 61 U/L (ref 38–126)
Anion gap: 9 (ref 5–15)
BUN: 18 mg/dL (ref 8–23)
CO2: 23 mmol/L (ref 22–32)
Calcium: 8.6 mg/dL — ABNORMAL LOW (ref 8.9–10.3)
Chloride: 106 mmol/L (ref 98–111)
Creatinine, Ser: 1.26 mg/dL — ABNORMAL HIGH (ref 0.61–1.24)
GFR calc Af Amer: >60 mL/min (ref >60)
GFR calc non Af Amer: 59 mL/min — ABNORMAL LOW (ref >60)
Glucose, Bld: 119 mg/dL — ABNORMAL HIGH (ref 70–99)
Potassium: 3.9 mmol/L (ref 3.5–5.1)
Sodium: 138 mmol/L (ref 135–145)
Total Bilirubin: 0.7 mg/dL (ref 0.3–1.2)
Total Protein: 7.8 g/dL (ref 6.5–8.1)

## 2019-05-18 LAB — SARS CORONAVIRUS 2 (TAT 6-24 HRS): SARS Coronavirus 2: NEGATIVE

## 2019-05-18 LAB — APTT: aPTT: 29 seconds (ref 24–36)

## 2019-05-20 ENCOUNTER — Telehealth: Payer: Self-pay | Admitting: *Deleted

## 2019-05-20 NOTE — H&P (Signed)
H&P  Chief Complaint: PCa  History of Present Illness: 69 yo male presents for I 125 brachytherapy for mgmt of PCa. His history is as follows:  He underwent TRUS/Bx on 5.3.2019. PSA 6.95, prostatic volume 33.45 ml. PSAD 0.21. 1/12/ cores (left apex medial) revealed PCa--Gleason 3+3 pattern, 20% of core involved.  IPSS 9  Quality of life score 2  SHIM score 14   9.25.2019: Most recent PSA 6 .80. Urinary symptoms about the same.  IPSS 10, quality of life score 0.   1.3.2020: MRI prostate. Prostate volume 37.94 ml. No disease evident in bones/LNs/SV. No transcapsular or NVI. 17x7x9 ml PIRADS 3 lesion in left apical region.   2.26.2020: Fusion TRUS/Bx. Volume 34 ml. 2/4 cores from ROI revealed GS 4+3 pattern ( 40, 5 %). 1/12 systematic cores (Lt apex medial) revealed GS 3+4 pattern (30% involvement).      Past Medical History:  Diagnosis Date  . Abnormal EKG    right bbb, left fasicular block since 2014  . Chronic kidney disease    ckd stage 2  . DM type 2 (diabetes mellitus, type 2) ([place])   . Elevated PSA   . GERD (gastroesophageal reflux disease)   . GI bleed 2015  . Gout   . History of kidney stones 2002 and 2006  . Hypercholesteremia   . Hypertension   . Macular degeneration   . Prostate cancer ([place])   . Sleep apnea    does not have cpap yet    Past Surgical History:  Procedure Laterality Date  . NASAL SINUS SURGERY    . PROSTATE BIOPSY    . RIGHT COLECTOMY  05/07/2006   polyp removed    Home Medications:  Allergies as of 05/20/2019   No Known Allergies     Medication List    Notice   Cannot display discharge medications because the patient has not yet been admitted.     Allergies: No Known Allergies  Family History  Problem Relation Age of Onset  . Heart attack Mother   . CVA Mother   . Hypertension Father   . Heart attack Brother     Social History:  reports that he has never smoked. He has never used smokeless tobacco. He reports previous  alcohol use. He reports that he does not use drugs.  ROS: A complete review of systems was performed.  All systems are negative except for pertinent findings as noted.  Physical Exam:  Vital signs in last 24 hours: Ht 5\' 8"  (1.727 m)   Wt 117.9 kg   BMI 39.53 kg/m  Constitutional:  Alert and oriented, No acute distress Cardiovascular: Regular rate  Respiratory: Normal respiratory effort GI: Abdomen is soft, nontender, nondistended, no abdominal masses. No CVAT.  Genitourinary: Normal male phallus, testes are descended bilaterally and non-tender and without masses, scrotum is normal in appearance without lesions or masses, perineum is normal on inspection. Lymphatic: No lymphadenopathy Neurologic: Grossly intact, no focal deficits Psychiatric: Normal mood and affect  Laboratory Data:  Recent Labs    05/18/19 1336  WBC 10.6*  HGB 13.1  HCT 40.2  PLT 273    Recent Labs    05/18/19 1336  NA 138  K 3.9  CL 106  GLUCOSE 119*  BUN 18  CALCIUM 8.6*  CREATININE 1.26*     No results found for this or any previous visit (from the past 24 hour(s)). Recent Results (from the past 240 hour(s))  SARS CORONAVIRUS 2 (TAT 6-24 HRS) Nasopharyngeal  Nasopharyngeal Swab     Status: None   Collection Time: 05/18/19  1:07 PM   Specimen: Nasopharyngeal Swab  Result Value Ref Range Status   SARS Coronavirus 2 NEGATIVE NEGATIVE Final    Comment: (NOTE) SARS-CoV-2 target nucleic acids are NOT DETECTED. The SARS-CoV-2 RNA is generally detectable in upper and lower respiratory specimens during the acute phase of infection. Negative results do not preclude SARS-CoV-2 infection, do not rule out co-infections with other pathogens, and should not be used as the sole basis for treatment or other patient management decisions. Negative results must be combined with clinical observations, patient history, and epidemiological information. The expected result is Negative. Fact Sheet for  Patients: SugarRoll.be Fact Sheet for Healthcare Providers: https://www.woods-mathews.com/ This test is not yet approved or cleared by the [place] FDA and  has been authorized for detection and/or diagnosis of SARS-CoV-2 by FDA under an Emergency Use Authorization (EUA). This EUA will remain  in effect (meaning this test can be used) for the duration of the COVID-19 declaration under Section 56 4(b)(1) of the Act, 21 U.S.C. section 360bbb-3(b)(1), unless the authorization is terminated or revoked sooner. Performed at [facility], [place] [address], [place], [place] [address]     Renal Function: Recent Labs    05/18/19 1336  CREATININE 1.26*   Estimated Creatinine Clearance: 71.9 mL/min (A) (by C-G formula based on SCr of 1.26 mg/dL (H)).  Radiologic Imaging: No results found.  Impression/Assessment:  PCa   Plan:  I 125 brachytherapy, SpaceOAR

## 2019-05-20 NOTE — Telephone Encounter (Signed)
CALLED PATIENT TO REMIND OF PROCEDURE FOR 05-21-19, LVM FOR A RETURN CALL

## 2019-05-21 ENCOUNTER — Ambulatory Visit (HOSPITAL_BASED_OUTPATIENT_CLINIC_OR_DEPARTMENT_OTHER)
Admission: RE | Admit: 2019-05-21 | Discharge: 2019-05-21 | Disposition: A | Discharge status: Home / Self Care | Payer: Medicare Other | Source: Other Acute Inpatient Hospital | Attending: Urology | Admitting: Urology

## 2019-05-21 ENCOUNTER — Ambulatory Visit (HOSPITAL_BASED_OUTPATIENT_CLINIC_OR_DEPARTMENT_OTHER): Payer: Medicare Other | Admitting: Anesthesiology

## 2019-05-21 ENCOUNTER — Encounter (HOSPITAL_BASED_OUTPATIENT_CLINIC_OR_DEPARTMENT_OTHER)
Admission: RE | Disposition: A | Discharge status: Home / Self Care | Payer: Self-pay | Source: Other Acute Inpatient Hospital | Attending: Urology

## 2019-05-21 ENCOUNTER — Ambulatory Visit (HOSPITAL_COMMUNITY): Payer: Medicare Other

## 2019-05-21 ENCOUNTER — Encounter (HOSPITAL_BASED_OUTPATIENT_CLINIC_OR_DEPARTMENT_OTHER): Payer: Self-pay | Admitting: Urology

## 2019-05-21 DIAGNOSIS — G4730 Sleep apnea, unspecified: Secondary | ICD-10-CM | POA: Diagnosis not present

## 2019-05-21 DIAGNOSIS — I444 Left anterior fascicular block: Secondary | ICD-10-CM | POA: Insufficient documentation

## 2019-05-21 DIAGNOSIS — Z86010 Personal history of colonic polyps: Secondary | ICD-10-CM | POA: Insufficient documentation

## 2019-05-21 DIAGNOSIS — E669 Obesity, unspecified: Secondary | ICD-10-CM | POA: Insufficient documentation

## 2019-05-21 DIAGNOSIS — M1990 Unspecified osteoarthritis, unspecified site: Secondary | ICD-10-CM | POA: Diagnosis not present

## 2019-05-21 DIAGNOSIS — Z8249 Family history of ischemic heart disease and other diseases of the circulatory system: Secondary | ICD-10-CM | POA: Diagnosis not present

## 2019-05-21 DIAGNOSIS — Z7984 Long term (current) use of oral hypoglycemic drugs: Secondary | ICD-10-CM | POA: Diagnosis not present

## 2019-05-21 DIAGNOSIS — C61 Malignant neoplasm of prostate: Secondary | ICD-10-CM | POA: Insufficient documentation

## 2019-05-21 DIAGNOSIS — K219 Gastro-esophageal reflux disease without esophagitis: Secondary | ICD-10-CM | POA: Diagnosis not present

## 2019-05-21 DIAGNOSIS — Z9049 Acquired absence of other specified parts of digestive tract: Secondary | ICD-10-CM | POA: Insufficient documentation

## 2019-05-21 DIAGNOSIS — Z87442 Personal history of urinary calculi: Secondary | ICD-10-CM | POA: Diagnosis not present

## 2019-05-21 DIAGNOSIS — I4510 Unspecified right bundle-branch block: Secondary | ICD-10-CM | POA: Diagnosis not present

## 2019-05-21 DIAGNOSIS — Z823 Family history of stroke: Secondary | ICD-10-CM | POA: Diagnosis not present

## 2019-05-21 DIAGNOSIS — Z6838 Body mass index (BMI) 38.0-38.9, adult: Secondary | ICD-10-CM | POA: Diagnosis not present

## 2019-05-21 DIAGNOSIS — I129 Hypertensive chronic kidney disease with stage 1 through stage 4 chronic kidney disease, or unspecified chronic kidney disease: Secondary | ICD-10-CM | POA: Diagnosis not present

## 2019-05-21 DIAGNOSIS — E7800 Pure hypercholesterolemia, unspecified: Secondary | ICD-10-CM | POA: Insufficient documentation

## 2019-05-21 DIAGNOSIS — N182 Chronic kidney disease, stage 2 (mild): Secondary | ICD-10-CM | POA: Insufficient documentation

## 2019-05-21 DIAGNOSIS — E1122 Type 2 diabetes mellitus with diabetic chronic kidney disease: Secondary | ICD-10-CM | POA: Diagnosis not present

## 2019-05-21 DIAGNOSIS — H3530 Unspecified macular degeneration: Secondary | ICD-10-CM | POA: Diagnosis not present

## 2019-05-21 DIAGNOSIS — I452 Bifascicular block: Secondary | ICD-10-CM | POA: Diagnosis not present

## 2019-05-21 DIAGNOSIS — M109 Gout, unspecified: Secondary | ICD-10-CM | POA: Diagnosis not present

## 2019-05-21 DIAGNOSIS — E785 Hyperlipidemia, unspecified: Secondary | ICD-10-CM | POA: Diagnosis not present

## 2019-05-21 DIAGNOSIS — E1140 Type 2 diabetes mellitus with diabetic neuropathy, unspecified: Secondary | ICD-10-CM | POA: Insufficient documentation

## 2019-05-21 HISTORY — PX: RADIOACTIVE SEED IMPLANT: SHX5150

## 2019-05-21 HISTORY — DX: Abnormal electrocardiogram (ECG) (EKG): R94.31

## 2019-05-21 HISTORY — DX: Sleep apnea, unspecified: G47.30

## 2019-05-21 HISTORY — PX: SPACE OAR INSTILLATION: SHX6769

## 2019-05-21 HISTORY — DX: Type 2 diabetes mellitus without complications: E11.9

## 2019-05-21 HISTORY — DX: Personal history of urinary calculi: Z87.442

## 2019-05-21 LAB — GLUCOSE, CAPILLARY
Glucose-Capillary: 125 mg/dL — ABNORMAL HIGH (ref 70–99)
Glucose-Capillary: 130 mg/dL — ABNORMAL HIGH (ref 70–99)

## 2019-05-21 SURGERY — INSERTION, RADIATION SOURCE, PROSTATE
Anesthesia: General | Site: Prostate

## 2019-05-21 MED ORDER — LACTATED RINGERS IV SOLN
INTRAVENOUS | Status: DC
  Filled 2019-05-21: qty 1000

## 2019-05-21 MED ORDER — SODIUM CHLORIDE 0.9 % IV SOLN
INTRAVENOUS | Status: AC | PRN
  Administered 2019-05-21: 1000 mL

## 2019-05-21 MED ORDER — ROCURONIUM BROMIDE 100 MG/10ML IV SOLN
INTRAVENOUS | Status: DC | PRN
  Administered 2019-05-21: 50 mg via INTRAVENOUS

## 2019-05-21 MED ORDER — FENTANYL CITRATE (PF) 100 MCG/2ML IJ SOLN
25.0000 ug | INTRAMUSCULAR | Status: DC | PRN
  Administered 2019-05-21: 100 ug via INTRAVENOUS
  Filled 2019-05-21: qty 1

## 2019-05-21 MED ORDER — FENTANYL CITRATE (PF) 100 MCG/2ML IJ SOLN
INTRAMUSCULAR | Status: AC
  Filled 2019-05-21: qty 2

## 2019-05-21 MED ORDER — MIDAZOLAM HCL 2 MG/2ML IJ SOLN
INTRAMUSCULAR | Status: AC
  Filled 2019-05-21: qty 2

## 2019-05-21 MED ORDER — MIDAZOLAM HCL 2 MG/2ML IJ SOLN
INTRAMUSCULAR | Status: DC | PRN
  Administered 2019-05-21: 2 mg via INTRAVENOUS

## 2019-05-21 MED ORDER — PROPOFOL 10 MG/ML IV BOLUS
INTRAVENOUS | Status: AC
  Filled 2019-05-21: qty 40

## 2019-05-21 MED ORDER — EPHEDRINE SULFATE 50 MG/ML IJ SOLN
INTRAMUSCULAR | Status: DC | PRN
  Administered 2019-05-21: 15 mg via INTRAVENOUS

## 2019-05-21 MED ORDER — LIDOCAINE 2% (20 MG/ML) 5 ML SYRINGE
INTRAMUSCULAR | Status: DC | PRN
  Administered 2019-05-21: 60 mg via INTRAVENOUS

## 2019-05-21 MED ORDER — ACETAMINOPHEN 500 MG PO TABS
1000.0000 mg | ORAL_TABLET | Freq: Once | ORAL | Status: AC
  Administered 2019-05-21: 1000 mg via ORAL
  Filled 2019-05-21: qty 2

## 2019-05-21 MED ORDER — IOHEXOL 300 MG/ML  SOLN
INTRAMUSCULAR | Status: DC | PRN
  Administered 2019-05-21: 7 mL

## 2019-05-21 MED ORDER — SODIUM CHLORIDE (PF) 0.9 % IJ SOLN
INTRAMUSCULAR | Status: DC | PRN
  Administered 2019-05-21: 3 mL

## 2019-05-21 MED ORDER — LIDOCAINE 2% (20 MG/ML) 5 ML SYRINGE
INTRAMUSCULAR | Status: AC
  Filled 2019-05-21: qty 5

## 2019-05-21 MED ORDER — DEXAMETHASONE SODIUM PHOSPHATE 10 MG/ML IJ SOLN
INTRAMUSCULAR | Status: AC
  Filled 2019-05-21: qty 1

## 2019-05-21 MED ORDER — ONDANSETRON HCL 4 MG/2ML IJ SOLN
INTRAMUSCULAR | Status: AC
  Filled 2019-05-21: qty 2

## 2019-05-21 MED ORDER — ONDANSETRON HCL 4 MG/2ML IJ SOLN
4.0000 mg | Freq: Once | INTRAMUSCULAR | Status: AC | PRN
  Administered 2019-05-21: 4 mg via INTRAVENOUS
  Filled 2019-05-21: qty 2

## 2019-05-21 MED ORDER — ACETAMINOPHEN 500 MG PO TABS
ORAL_TABLET | ORAL | Status: AC
  Filled 2019-05-21: qty 2

## 2019-05-21 MED ORDER — CEFAZOLIN SODIUM-DEXTROSE 2-4 GM/100ML-% IV SOLN
2.0000 g | Freq: Once | INTRAVENOUS | Status: AC
  Administered 2019-05-21: 2 g via INTRAVENOUS
  Filled 2019-05-21: qty 100

## 2019-05-21 MED ORDER — FLEET ENEMA 7-19 GM/118ML RE ENEM
1.0000 | ENEMA | Freq: Once | RECTAL | Status: DC
  Filled 2019-05-21: qty 1

## 2019-05-21 MED ORDER — KETOROLAC TROMETHAMINE 30 MG/ML IJ SOLN
INTRAMUSCULAR | Status: DC | PRN
  Administered 2019-05-21: 30 mg via INTRAVENOUS

## 2019-05-21 MED ORDER — KETOROLAC TROMETHAMINE 30 MG/ML IJ SOLN
INTRAMUSCULAR | Status: AC
  Filled 2019-05-21: qty 1

## 2019-05-21 MED ORDER — CEFAZOLIN SODIUM-DEXTROSE 2-4 GM/100ML-% IV SOLN
INTRAVENOUS | Status: AC
  Filled 2019-05-21: qty 100

## 2019-05-21 MED ORDER — PROPOFOL 10 MG/ML IV BOLUS
INTRAVENOUS | Status: DC | PRN
  Administered 2019-05-21: 200 mg via INTRAVENOUS

## 2019-05-21 MED ORDER — DEXAMETHASONE SODIUM PHOSPHATE 10 MG/ML IJ SOLN
INTRAMUSCULAR | Status: DC | PRN
  Administered 2019-05-21: 5 mg via INTRAVENOUS

## 2019-05-21 MED ORDER — SUGAMMADEX SODIUM 200 MG/2ML IV SOLN
INTRAVENOUS | Status: DC | PRN
  Administered 2019-05-21: 200 mg via INTRAVENOUS

## 2019-05-21 SURGICAL SUPPLY — 34 items
BAG DRN RND TRDRP ANRFLXCHMBR (UROLOGICAL SUPPLIES) ×1
BAG URINE DRAIN 2000ML AR STRL (UROLOGICAL SUPPLIES) ×3 IMPLANT
BLADE CLIPPER SENSICLIP SURGIC (BLADE) ×3 IMPLANT
CATH FOLEY 2WAY SLVR  5CC 16FR (CATHETERS) ×3
CATH FOLEY 2WAY SLVR 5CC 16FR (CATHETERS) ×1 IMPLANT
CATH ROBINSON RED A/P 16FR (CATHETERS) IMPLANT
CATH ROBINSON RED A/P 20FR (CATHETERS) ×3 IMPLANT
CLOTH BEACON ORANGE TIMEOUT ST (SAFETY) ×3 IMPLANT
CNTNR URN SCR LID CUP LEK RST (MISCELLANEOUS) ×2 IMPLANT
CONT SPEC 4OZ STRL OR WHT (MISCELLANEOUS) ×6
COVER BACK TABLE 60X90IN (DRAPES) ×3 IMPLANT
COVER MAYO STAND STRL (DRAPES) ×3 IMPLANT
DRSG TEGADERM 4X4.75 (GAUZE/BANDAGES/DRESSINGS) ×4 IMPLANT
DRSG TEGADERM 8X12 (GAUZE/BANDAGES/DRESSINGS) ×6 IMPLANT
GAUZE SPONGE 4X4 12PLY STRL (GAUZE/BANDAGES/DRESSINGS) ×2 IMPLANT
GLOVE BIO SURGEON STRL SZ7.5 (GLOVE) IMPLANT
GLOVE BIO SURGEON STRL SZ8 (GLOVE) ×6 IMPLANT
GLOVE SURG ORTHO 8.5 STRL (GLOVE) ×3 IMPLANT
GLOVE SURG SS PI 6.5 STRL IVOR (GLOVE) IMPLANT
GOWN STRL REUS W/TWL XL LVL3 (GOWN DISPOSABLE) ×3 IMPLANT
HOLDER FOLEY CATH W/STRAP (MISCELLANEOUS) ×1 IMPLANT
I-SEEDAGX100 ×154 IMPLANT
IMPL SPACEOAR SYSTEM 10ML (Spacer) ×1 IMPLANT
IMPLANT SPACEOAR SYSTEM 10ML (Spacer) ×3 IMPLANT
IV NS 1000ML (IV SOLUTION) ×3
IV NS 1000ML BAXH (IV SOLUTION) ×1 IMPLANT
KIT TURNOVER CYSTO (KITS) ×3 IMPLANT
MARKER SKIN DUAL TIP RULER LAB (MISCELLANEOUS) ×3 IMPLANT
PACK CYSTO (CUSTOM PROCEDURE TRAY) ×3 IMPLANT
SUT BONE WAX W31G (SUTURE) IMPLANT
SYR 10ML LL (SYRINGE) ×3 IMPLANT
TOWEL OR 17X26 10 PK STRL BLUE (TOWEL DISPOSABLE) ×3 IMPLANT
UNDERPAD 30X30 (UNDERPADS AND DIAPERS) ×6 IMPLANT
WATER STERILE IRR 500ML POUR (IV SOLUTION) ×3 IMPLANT

## 2019-05-21 NOTE — Progress Notes (Signed)
  Radiation Oncology         [phone] ________________________________  Name: Joshua Roberts MRN: [id number]  Date: 05/21/2019  DOB: 09-15-1950       Prostate Seed Implant  [id number], Thayer Jew, MD  No ref. provider found  DIAGNOSIS:  Oncology History Overview Note  69 y.o. gentleman with Stage T1c adenocarcinoma of the prostate with Gleason score of 4+3, and PSA of 10.2   Malignant neoplasm of prostate ([place])  04/02/2018 Cancer Staging   Staging form: Prostate, AJCC 8th Edition - Clinical stage from 04/02/2018: Stage IIC (cT1c, cN0, cM0, PSA: 10.2, Grade Group: 3) - Signed by Freeman Caldron, PA-C on 04/16/2019   12/23/2018 Initial Diagnosis   Malignant neoplasm of prostate ([place])     No diagnosis found.  PROCEDURE: Insertion of radioactive I-125 seeds into the prostate gland.  RADIATION DOSE: 145 Gy, definitive therapy.  TECHNIQUE: AARONJAMES ENCISO was brought to the operating room with the urologist. He was placed in the dorsolithotomy position. He was catheterized and a rectal tube was inserted. The perineum was shaved, prepped and draped. The ultrasound probe was then introduced into the rectum to see the prostate gland.  TREATMENT DEVICE: A needle grid was attached to the ultrasound probe stand and anchor needles were placed.  3D PLANNING: The prostate was imaged in 3D using a sagittal sweep of the prostate probe. These images were transferred to the planning computer. There, the prostate, urethra and rectum were defined on each axial reconstructed image. Then, the software created an optimized 3D plan and a few seed positions were adjusted. The quality of the plan was reviewed using [facility] information for the target and the following two organs at risk:  Urethra and Rectum.  Then the accepted plan was printed and handed off to the radiation therapist.  Under my supervision, the custom loading of the seeds and spacers was carried out and loaded into sealed vicryl sleeves.  These  pre-loaded needles were then placed into the needle holder.Marland Kitchen  PROSTATE VOLUME STUDY:  Using transrectal ultrasound the volume of the prostate was verified to be 26.9 cc.  SPECIAL TREATMENT PROCEDURE/SUPERVISION AND HANDLING: The pre-loaded needles were then delivered under sagittal guidance. A total of 25 needles were used to deposit 71 seeds in the prostate gland. The individual seed activity was 0.325 mCi.  SpaceOAR:  Yes  COMPLEX SIMULATION: At the end of the procedure, an anterior radiograph of the pelvis was obtained to document seed positioning and count. Cystoscopy was performed to check the urethra and bladder.  MICRODOSIMETRY: At the end of the procedure, the patient was emitting 0.056 mR/hr at 1 meter. Accordingly, he was considered safe for hospital discharge.  PLAN: The patient will return to the radiation oncology clinic for post implant CT dosimetry in three weeks.   ________________________________  Sheral Apley Tammi Klippel, M.D.

## 2019-05-21 NOTE — Transfer of Care (Signed)
Immediate Anesthesia Transfer of Care Note  Patient: Joshua Roberts  Procedure(s) Performed: RADIOACTIVE SEED IMPLANT/BRACHYTHERAPY IMPLANT (N/A Prostate) SPACE OAR INSTILLATION (N/A Prostate)  Patient Location: PACU  Anesthesia Type:General  Level of Consciousness: awake, alert , oriented and patient cooperative  Airway & Oxygen Therapy: Patient Spontanous Breathing and Patient connected to nasal cannula oxygen  Post-op Assessment: Report given to RN and Post -op Vital signs reviewed and stable  Post vital signs: Reviewed and stable  Last Vitals:  Vitals Value Taken Time  BP 129/69 05/21/19 1050  Temp    Pulse 65 05/21/19 1052  Resp 20 05/21/19 1052  SpO2 99 % 05/21/19 1052  Vitals shown include unvalidated device data.  Last Pain:  Vitals:   05/21/19 0833  TempSrc: Oral  PainSc: 0-No pain      Patients Stated Pain Goal: 5 ([id number])  Complications: No apparent anesthesia complications

## 2019-05-21 NOTE — Op Note (Signed)
Preoperative diagnosis: Clinical stage TI C adenocarcinoma the prostate   Postoperative diagnosis: Same   Procedure: I-125 prostate seed implantation, flexible cystoscopy  Surgeon: Lillette Boxer. Jami Bogdanski M.D.  Radiation Oncologist: Tyler Pita, M.D.  Anesthesia: Gen.   Indications: Patient  was diagnosed with clinical stage TI C prostate cancer. We had extensive discussion with him about treatment options versus. He elected to proceed with seed implantation. He underwent consultation my office as well as with Dr. Tammi Klippel. He appeared to understand the advantages disadvantages potential risks of this treatment option. Full informed consent has been obtained.   Technique and findings: Patient was brought the operating room where he had successful induction of general anesthesia. He was placed in dorso-lithotomy position and prepped and draped in usual manner. Appropriate surgical timeout was performed. Radiation oncology department placed a transrectal ultrasound probe anchoring stand. Foley catheter with contrast in the balloon was inserted without difficulty. Anchoring needles were placed within the prostate. Rectal tube was placed. Real-time contouring of the urethra prostate and rectum were performed and the dosing parameters were established. Targeted dose was 145 gray.  I was then called  to the operating suite suite for placement of the needles. A second timeout was performed. All needle passage was done with real-time transrectal ultrasound guidance with the sagittal plane. A total of 21 needles were placed.  77 active seeds were implanted.  I then proceeded with placement of SpaceOAR by introducing a needle with the bevel angled inferiorly approximately 2 cm superior to the anus. This was angled downward and under direct ultrasound was placed within the space between the prostatic capsule and rectum. This was confirmed with a small amount of sterile saline injected and this was performed  under direct ultrasound. I then attached the SpaceOAR to the needle and injected this in the space between the prostate and rectum with good placement noted. The Foley catheter was removed and flexible cystoscopy failed to show any seeds outside the prostate.  The patient was brought to recovery room in stable condition, having tolerated the procedure well.Marland Kitchen

## 2019-05-21 NOTE — Interval H&P Note (Signed)
History and Physical Interval Note:  05/21/2019 9:21 AM  Joshua Roberts  has presented today for surgery, with the diagnosis of PROSTATE CANCER.  The various methods of treatment have been discussed with the patient and family. After consideration of risks, benefits and other options for treatment, the patient has consented to  Procedure(s) with comments: RADIOACTIVE SEED IMPLANT/BRACHYTHERAPY IMPLANT (N/A) - 90 MINS SPACE OAR INSTILLATION (N/A) as a surgical intervention.  The patient's history has been reviewed, patient examined, no change in status, stable for surgery.  I have reviewed the patient's chart and labs.  Questions were answered to the patient's satisfaction.     Lillette Boxer Betty Daidone

## 2019-05-21 NOTE — Anesthesia Postprocedure Evaluation (Signed)
Anesthesia Post Note  Patient: ARIEH FRASIER  Procedure(s) Performed: RADIOACTIVE SEED IMPLANT/BRACHYTHERAPY IMPLANT (N/A Prostate) SPACE OAR INSTILLATION (N/A Prostate)     Patient location during evaluation: PACU Anesthesia Type: General Level of consciousness: awake and alert Pain management: pain level controlled Vital Signs Assessment: post-procedure vital signs reviewed and stable Respiratory status: spontaneous breathing, nonlabored ventilation, respiratory function stable and patient connected to nasal cannula oxygen Cardiovascular status: blood pressure returned to baseline and stable Postop Assessment: no apparent nausea or vomiting Anesthetic complications: no    Last Vitals:  Vitals:   05/21/19 1115 05/21/19 1253  BP: 129/70 113/64  Pulse: 66 64  Resp: (!) 21 18  Temp:  (!) 36.3 C  SpO2: 99% 96%    Last Pain:  Vitals:   05/21/19 1253  TempSrc:   PainSc: 0-No pain                 Queena Monrreal P Diedre Maclellan

## 2019-05-21 NOTE — Anesthesia Procedure Notes (Signed)
Procedure Name: Intubation Date/Time: 05/21/2019 9:37 AM Performed by: Wanita Chamberlain, CRNA Pre-anesthesia Checklist: Patient identified, Emergency Drugs available, Suction available, Patient being monitored and Timeout performed Patient Re-evaluated:Patient Re-evaluated prior to induction Oxygen Delivery Method: Circle system utilized Preoxygenation: Pre-oxygenation with 100% oxygen Induction Type: IV induction Ventilation: Oral airway inserted - appropriate to patient size and Mask ventilation with difficulty Laryngoscope Size: Mac and 4 Grade View: Grade III Tube type: Oral Tube size: 8.0 mm Number of attempts: 1 Airway Equipment and Method: Stylet Placement Confirmation: breath sounds checked- equal and bilateral,  CO2 detector,  positive ETCO2 and ETT inserted through vocal cords under direct vision Secured at: 23 cm Tube secured with: Tape Dental Injury: Teeth and Oropharynx as per pre-operative assessment  Difficulty Due To: Difficulty was anticipated Comments: Floppy epiglottis

## 2019-05-21 NOTE — Discharge Instructions (Signed)
Radioactive Seed Implant Home Care Instructions   Activity:    Rest for the remainder of the day.  Do not drive or operate equipment today.  You may resume normal  activities in a few days as instructed by your physician, without risk of harmful radiation exposure to those around you, provided you follow the time and distance precautions on the Radiation Oncology Instruction Sheet.   Meals: Drink plenty of lipuids and eat light foods, such as gelatin or soup this evening .  You may return to normal meal plan tomorrow.  Return To Work: You may return to work as instructed by Naval architect.  Special Instruction:   If any seeds are found, use tweezers to pick up seeds and place in a glass container of any kind and bring to your physician's office.  Call your physician if any of these symptoms occur:   Persistent or heavy bleeding  Urine stream diminishes or stops completely after catheter is removed  Fever equal to or greater than 101 degrees F  Cloudy urine with a strong foul odor  Severe pain  You may feel some burning pain and/or hesitancy when you urinate after the catheter is removed.  These symptoms may increase over the next few weeks, but should diminish within forur to six weeks.  Applying moist heat to the lower abdomen or a hot tub bath may help relieve the pain.  If the discomfort becomes severe, please call your physician for additional medications.  NO ADVIL, ALEVE, MOTRIN, IBUPROFEN UNTIL 430 PM TODAY  Post Anesthesia Home Care Instructions  Activity: Get plenty of rest for the remainder of the day. A responsible adult should stay with you for 24 hours following the procedure.  For the next 24 hours, DO NOT: -Drive a car -Paediatric nurse -Drink alcoholic beverages -Take any medication unless instructed by your physician -Make any legal decisions or sign important papers.  Meals: Start with liquid foods such as gelatin or soup. Progress to regular foods as  tolerated. Avoid greasy, spicy, heavy foods. If nausea and/or vomiting occur, drink only clear liquids until the nausea and/or vomiting subsides. Call your physician if vomiting continues.  Special Instructions/Symptoms: Your throat may feel dry or sore from the anesthesia or the breathing tube placed in your throat during surgery. If this causes discomfort, gargle with warm salt water. The discomfort should disappear within 24 hours.  If you had a scopolamine patch placed behind your ear for the management of post- operative nausea and/or vomiting:  1. The medication in the patch is effective for 72 hours, after which it should be removed.  Wrap patch in a tissue and discard in the trash. Wash hands thoroughly with soap and water. 2. You may remove the patch earlier than 72 hours if you experience unpleasant side effects which may include dry mouth, dizziness or visual disturbances. 3. Avoid touching the patch. Wash your hands with soap and water after contact with the patch.

## 2019-05-21 NOTE — Anesthesia Preprocedure Evaluation (Addendum)
Anesthesia Evaluation  Patient identified by MRN, date of birth, ID band Patient awake    Reviewed: Allergy & Precautions, NPO status , Patient's Chart, lab work & pertinent test results  Airway Mallampati: IV  TM Distance: >3 FB Neck ROM: Full    Dental no notable dental hx. (+) Dental Advisory Given, Poor Dentition   Pulmonary sleep apnea ,    Pulmonary exam normal breath sounds clear to auscultation       Cardiovascular hypertension, Pt. on medications Normal cardiovascular exam Rhythm:Regular Rate:Normal  ECG: rate 78 Normal sinus rhythm Right bundle branch block Left anterior fascicular block Bifascicular block    Neuro/Psych PSYCHIATRIC DISORDERS negative neurological ROS     GI/Hepatic Neg liver ROS, GERD  Medicated and Controlled,  Endo/Other  diabetes, Oral Hypoglycemic Agents  Renal/GU Renal disease     Musculoskeletal  (+) Arthritis , Gout   Abdominal (+) + obese,   Peds  Hematology HLD   Anesthesia Other Findings PROSTATE CANCER  Reproductive/Obstetrics                          Anesthesia Physical Anesthesia Plan  ASA: III  Anesthesia Plan: General   Post-op Pain Management:    Induction: Intravenous  PONV Risk Score and Plan: 3 and Ondansetron, Dexamethasone, Midazolam and Treatment may vary due to age or medical condition  Airway Management Planned: LMA  Additional Equipment:   Intra-op Plan:   Post-operative Plan: Extubation in OR  Informed Consent: I have reviewed the patients History and Physical, chart, labs and discussed the procedure including the risks, benefits and alternatives for the proposed anesthesia with the patient or authorized representative who has indicated his/her understanding and acceptance.     Dental advisory given  Plan Discussed with: CRNA  Anesthesia Plan Comments:        Anesthesia Quick Evaluation

## 2019-06-04 DIAGNOSIS — I129 Hypertensive chronic kidney disease with stage 1 through stage 4 chronic kidney disease, or unspecified chronic kidney disease: Secondary | ICD-10-CM | POA: Diagnosis not present

## 2019-06-04 DIAGNOSIS — N1831 Chronic kidney disease, stage 3a: Secondary | ICD-10-CM | POA: Diagnosis not present

## 2019-06-04 DIAGNOSIS — Z9989 Dependence on other enabling machines and devices: Secondary | ICD-10-CM | POA: Diagnosis not present

## 2019-06-04 DIAGNOSIS — E1122 Type 2 diabetes mellitus with diabetic chronic kidney disease: Secondary | ICD-10-CM | POA: Diagnosis not present

## 2019-06-04 DIAGNOSIS — G4733 Obstructive sleep apnea (adult) (pediatric): Secondary | ICD-10-CM | POA: Diagnosis not present

## 2019-06-11 ENCOUNTER — Ambulatory Visit
Admission: RE | Admit: 2019-06-11 | Discharge: 2019-06-11 | Disposition: A | Discharge status: Home / Self Care | Payer: Medicare Other | Source: Ambulatory Visit | Attending: Radiation Oncology | Admitting: Radiation Oncology

## 2019-06-11 ENCOUNTER — Other Ambulatory Visit: Payer: Self-pay

## 2019-06-11 ENCOUNTER — Encounter: Payer: Self-pay | Admitting: Urology

## 2019-06-11 ENCOUNTER — Ambulatory Visit
Admission: RE | Admit: 2019-06-11 | Discharge: 2019-06-11 | Disposition: A | Discharge status: Home / Self Care | Payer: Medicare Other | Source: Ambulatory Visit | Attending: Urology | Admitting: Urology

## 2019-06-11 ENCOUNTER — Ambulatory Visit (HOSPITAL_COMMUNITY)
Admission: RE | Admit: 2019-06-11 | Discharge: 2019-06-11 | Disposition: A | Discharge status: Home / Self Care | Payer: Medicare Other | Source: Ambulatory Visit | Attending: Urology | Admitting: Urology

## 2019-06-11 VITALS — BP 137/84 | HR 73 | Temp 98.0°F | Resp 20 | Ht 68.0 in

## 2019-06-11 DIAGNOSIS — R3911 Hesitancy of micturition: Secondary | ICD-10-CM | POA: Insufficient documentation

## 2019-06-11 DIAGNOSIS — C61 Malignant neoplasm of prostate: Secondary | ICD-10-CM | POA: Insufficient documentation

## 2019-06-11 DIAGNOSIS — Z7984 Long term (current) use of oral hypoglycemic drugs: Secondary | ICD-10-CM | POA: Insufficient documentation

## 2019-06-11 DIAGNOSIS — Z79899 Other long term (current) drug therapy: Secondary | ICD-10-CM | POA: Insufficient documentation

## 2019-06-11 DIAGNOSIS — R350 Frequency of micturition: Secondary | ICD-10-CM | POA: Insufficient documentation

## 2019-06-11 NOTE — Progress Notes (Signed)
Radiation Oncology         [phone]) [phone] ________________________________  Name: CUINN BECHEL MRN: [id number]  Date: 06/11/2019  DOB: 02-01-1951  Post-Seed Follow-Up Visit Note  CC: Deland Pretty, MD  Deland Pretty, MD  Diagnosis:   69 y.o. gentleman with Stage T1c adenocarcinoma of the prostate with Gleason score of 4+3, and PSA of 10.2.    ICD-10-CM   1. Malignant neoplasm of prostate (HCC)  C61     Interval Since Last Radiation:  3 weeks 05/21/19:  Insertion of radioactive I-125 seeds into the prostate gland; 145 Gy, definitive/boost therapy with placement of SpaceOAR gel.  Narrative:  The patient returns today for routine follow-up.  He is complaining of increased urinary frequency and urinary hesitation symptoms. He filled out a questionnaire regarding urinary function today providing and overall IPSS score of 12 characterizing his symptoms as mild-moderate with mild increased frequency and urgency with occasional hesitancy and intermittency.  He specifically denies dysuria, gross hematuria, straining to void, excessive daytime frequency, incomplete bladder emptying or incontinence.  His pre-implant score was 3. He denies any abdominal pain or bowel symptoms.  Overall, he is quite pleased with his progress to date.  ALLERGIES:  has No Known Allergies.  Meds: Current Outpatient Medications  Medication Sig Dispense Refill  . allopurinol (ZYLOPRIM) 300 MG tablet Take 300 mg by mouth at bedtime.     Marland Kitchen amLODipine-olmesartan (AZOR) 10-40 MG tablet Take 1 tablet by mouth at bedtime.     Marland Kitchen aspirin 81 MG tablet Take 81 mg by mouth daily.    Marland Kitchen BYSTOLIC 20 MG TABS at bedtime.     . Cyanocobalamin (VITAMIN B 12 PO) Take by mouth.    . metFORMIN (GLUCOPHAGE-XR) 500 MG 24 hr tablet Take 1 tablet by mouth every evening.   3  . omeprazole (PRILOSEC) 20 MG capsule Take 20 mg by mouth at bedtime.     Glory Rosebush VERIO test strip AS DIRECTED TO CHECK BLOOD GLUCOSE TWICE DAILY. E11.65 IN VITRO 90  DAYS    . OZEMPIC, 0.25 OR 0.5 MG/DOSE, 2 MG/1.5ML SOPN thursdays    . pravastatin (PRAVACHOL) 80 MG tablet Take 80 mg by mouth at bedtime.     . sertraline (ZOLOFT) 50 MG tablet Take 50 mg by mouth daily.    Marland Kitchen VITAMIN D, CHOLECALCIFEROL, PO Take by mouth daily.    Marland Kitchen LORazepam (ATIVAN) 0.5 MG tablet Take 0.5 mg by mouth 3 times/day as needed-between meals & bedtime for anxiety.     No current facility-administered medications for this encounter.    Physical Findings: In general this is a well appearing African-American male in no acute distress. He's alert and oriented x4 and appropriate throughout the examination. Cardiopulmonary assessment is negative for acute distress and he exhibits normal effort.   Lab Findings: Lab Results  Component Value Date   WBC 10.6 (H) 05/18/2019   HGB 13.1 05/18/2019   HCT 40.2 05/18/2019   MCV 83.4 05/18/2019   PLT 273 05/18/2019    Radiographic Findings:  Patient underwent CT imaging in our clinic for post implant dosimetry. The CT will be reviewed by Dr. Tammi Klippel to confirm there is an adequate distribution of radioactive seeds throughout the prostate gland and ensure that there are no seeds in or near the rectum. His scheduled for prostate MRI at 3pm today and those images will be fused with his CT images for further evaluation. We suspect the final radiation plan and dosimetry will show appropriate coverage  of the prostate gland. He understands that we will call and inform him of any unexpected findings on further review of his imaging and dosimetry.  Impression/Plan: 69 y.o. gentleman with Stage T1c adenocarcinoma of the prostate with Gleason score of 4+3, and PSA of 10.2.  The patient is recovering from the effects of radiation. His urinary symptoms should gradually improve over the next 4-6 months. We talked about this today. He is encouraged by his improvement already and is otherwise pleased with his outcome. We also talked about long-term  follow-up for prostate cancer following seed implant. He understands that ongoing PSA determinations and digital rectal exams will help perform surveillance to rule out disease recurrence. He has a follow up appointment scheduled with Dr. Diona Fanti on Friday, 06/12/2019. He understands what to expect with his PSA measures. Patient was also educated today about some of the long-term effects from radiation including a small risk for rectal bleeding and possibly erectile dysfunction. We talked about some of the general management approaches to these potential complications. However, I did encourage the patient to contact our office or return at any point if he has questions or concerns related to his previous radiation and prostate cancer.  Today, a comprehensive survivorship care plan and treatment summary was reviewed with the patient today detailing his prostate cancer diagnosis, treatment course, potential late/long-term effects of treatment, appropriate follow-up care with recommendations for the future, and patient education resources.  A copy of this summary, along with a letter will be sent to the patient's primary care provider via mail/fax/In Basket message after today's visit.  2. Cancer screening:  Due to Mr. Strike history and his age, he should receive screening for skin cancers, and colon cancer.  The information and recommendations are listed on the patient's comprehensive care plan/treatment summary and were reviewed in detail with the patient.     3. Health maintenance and wellness promotion: Mr. Szot was encouraged to consume 5-7 servings of fruits and vegetables per day. He was provided a copy of the "Nutrition Rainbow" handout, as well as the handout "Take Control of Your Health and [place]" from the [place].  He was also encouraged to engage in moderate to vigorous exercise for 30 minutes per day most days of the week. Information was provided regarding the  [facility] fitness program, which is designed for cancer survivors to help them become more physically fit after cancer treatments. We discussed that a healthy BMI is 18.5-24.9 and that maintaining a healthy weight reduces risk of cancer recurrences.  He was instructed to limit his alcohol consumption and continue to abstain from tobacco use.  Lastly, he was encouraged to use sunscreen and wear protective clothing when in the sun.     4. Support services/counseling: It is not uncommon for this period of the patient's cancer care trajectory to be one of many emotions and stressors.  Mr. Glickstein was encouraged to take advantage of our many support services programs, support groups, and/or counseling in coping with his new life as a cancer survivor after completing anti-cancer treatment.  He was offered support today through active listening and expressive supportive counseling.  He was given information regarding our available services and encouraged to contact me with any questions or for help enrolling in any of our support group/programs.       Nicholos Johns, PA-C

## 2019-06-11 NOTE — Progress Notes (Signed)
Patient present in clinic today for follow up due to malignant neoplasm of the prostate denies any hematuria dysuria reports some frequency leakage and urgency. Does not feel he is emptying his bladder. Denies any blood in his stool or diarrhea. Denies any abdominal or lower back pain. Nocturia 2-3 times a night.

## 2019-06-13 NOTE — Progress Notes (Signed)
  Radiation Oncology         [phone]) [phone] ________________________________  Name: Joshua Roberts MRN: [id number]  Date: 06/11/2019  DOB: [birth year]  COMPLEX SIMULATION NOTE  NARRATIVE:  The patient was brought to the [facility] today following prostate seed implantation approximately one month ago.  Identity was confirmed.  All relevant records and images related to the planned course of therapy were reviewed.  Then, the patient was set-up supine.  CT images were obtained.  The CT images were loaded into the planning software.  Then the prostate and rectum were contoured.  Treatment planning then occurred.  The implanted iodine 125 seeds were identified by the physics staff for projection of radiation distribution  I have requested : 3D Simulation  I have requested a DVH of the following structures: Prostate and rectum.    ________________________________  Sheral Apley Tammi Klippel, M.D.

## 2019-06-17 DIAGNOSIS — E113293 Type 2 diabetes mellitus with mild nonproliferative diabetic retinopathy without macular edema, bilateral: Secondary | ICD-10-CM | POA: Diagnosis not present

## 2019-06-17 DIAGNOSIS — H40013 Open angle with borderline findings, low risk, bilateral: Secondary | ICD-10-CM | POA: Diagnosis not present

## 2019-06-17 DIAGNOSIS — Z794 Long term (current) use of insulin: Secondary | ICD-10-CM | POA: Diagnosis not present

## 2019-06-17 DIAGNOSIS — H35033 Hypertensive retinopathy, bilateral: Secondary | ICD-10-CM | POA: Diagnosis not present

## 2019-06-18 ENCOUNTER — Encounter: Payer: Self-pay | Admitting: Radiation Oncology

## 2019-06-18 DIAGNOSIS — C61 Malignant neoplasm of prostate: Secondary | ICD-10-CM | POA: Diagnosis not present

## 2019-07-07 NOTE — Progress Notes (Signed)
  Radiation Oncology         [phone]) [phone] ________________________________  Name: Joshua Roberts MRN: [id number]  Date: 06/18/2019  DOB: Jul 01, 1950  3D Planning Note   Prostate Brachytherapy Post-Implant Dosimetry  Diagnosis: 69 y.o. gentleman with Stage T1c adenocarcinoma of the prostate with Gleason score of 4+3, and PSA of 10.2  Narrative: On a previous date, Joshua Roberts returned following prostate seed implantation for post implant planning. He underwent CT scan complex simulation to delineate the three-dimensional structures of the pelvis and demonstrate the radiation distribution.  Since that time, the seed localization, and complex isodose planning with dose volume histograms have now been completed.  Results:   Prostate Coverage - The dose of radiation delivered to the 90% or more of the prostate gland (D90) was 96.89% of the prescription dose. This exceeds our goal of greater than 90%. Rectal Sparing - The volume of rectal tissue receiving the prescription dose or higher was 0.0 cc. This falls under our thresholds tolerance of 1.0 cc.  Impression: The prostate seed implant appears to show adequate target coverage and appropriate rectal sparing.  Plan:  The patient will continue to follow with urology for ongoing PSA determinations. I would anticipate a high likelihood for local tumor control with minimal risk for rectal morbidity.  ________________________________  Sheral Apley Tammi Klippel, M.D.

## 2019-08-06 IMAGING — US US RENAL
1 series · 14 of 25 positions shown · non-contrast
Comparison: Body CT 07/21/2003

CLINICAL DATA: Chronic renal disease.

EXAM:
RENAL / URINARY TRACT ULTRASOUND COMPLETE

[Series 1: us renal · 0.19mm/px · 14 of 35 slices shown]
[im 1/35]
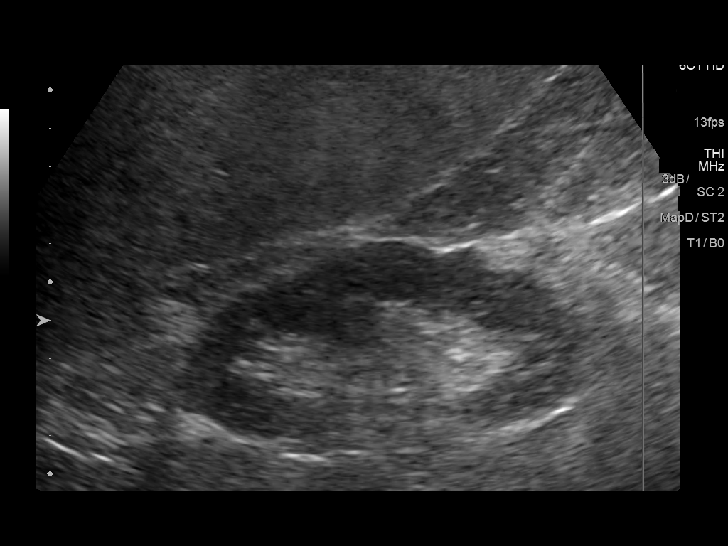
[im 3/35]
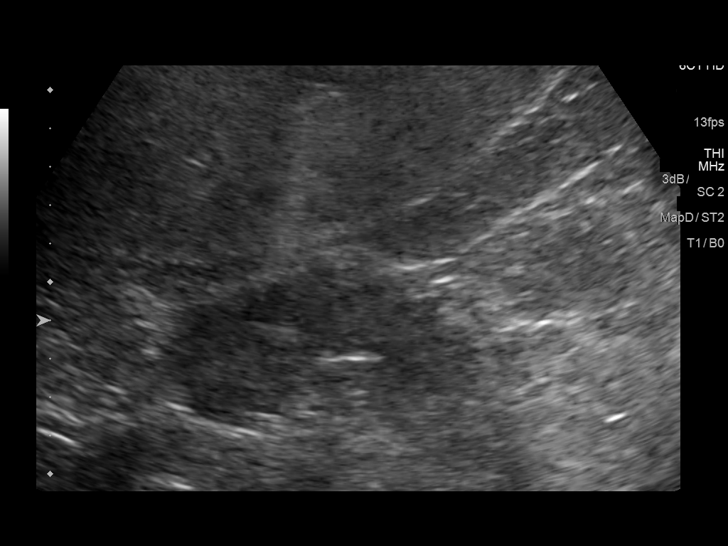
[im 6/35]
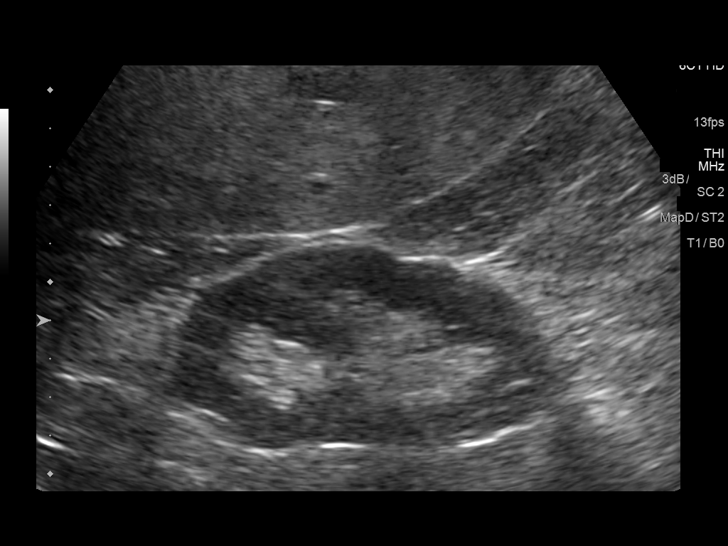
[im 9/35]
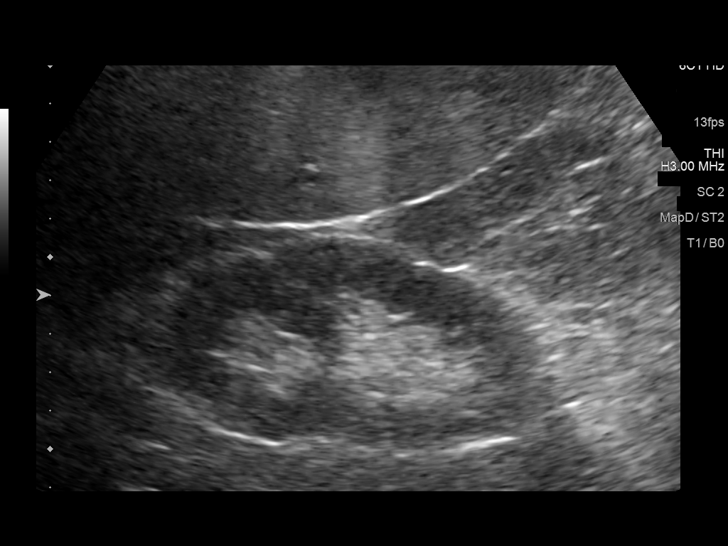
[im 12/35]
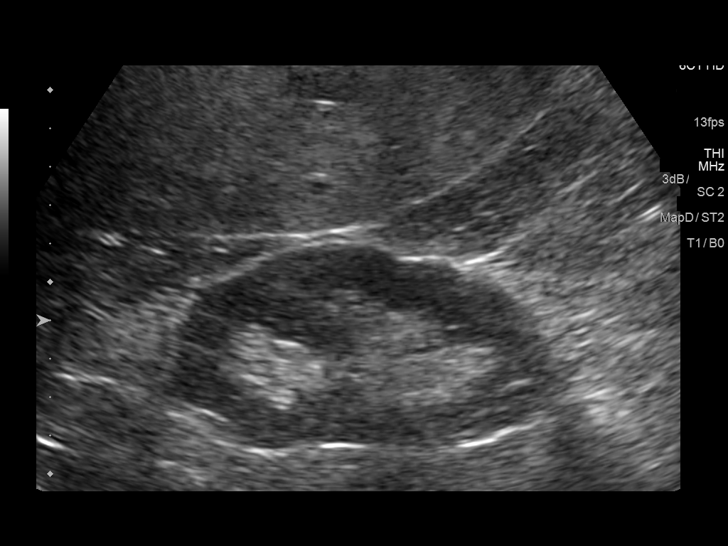
[im 13/35]
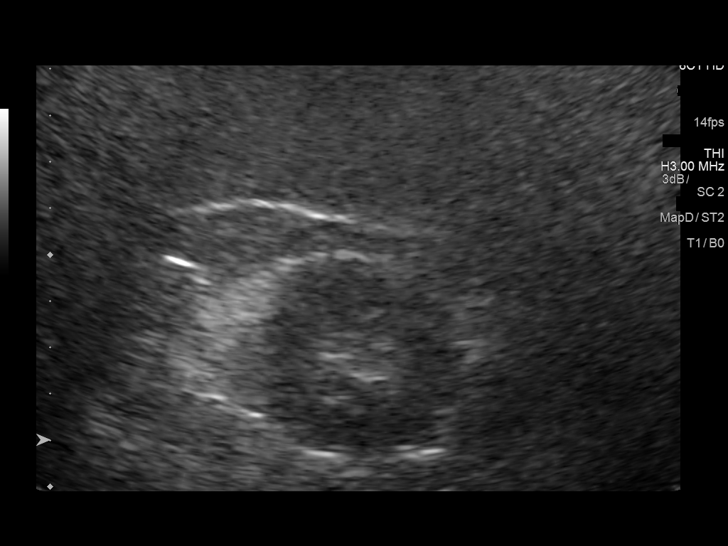
[im 16/35]
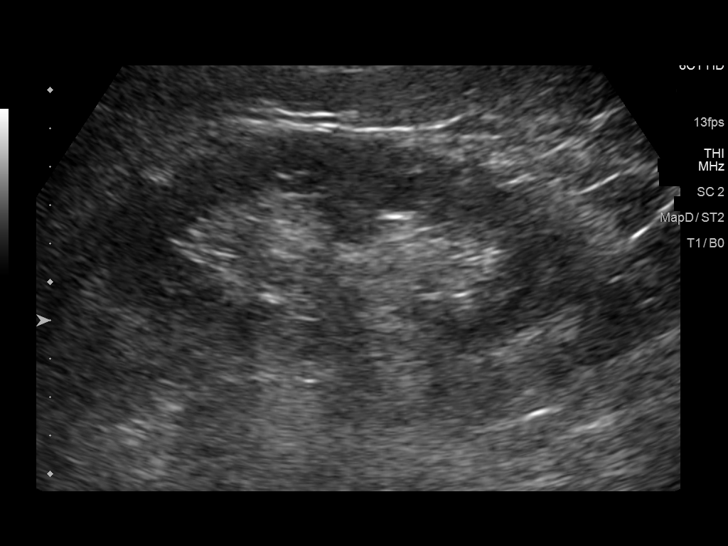
[im 19/35]
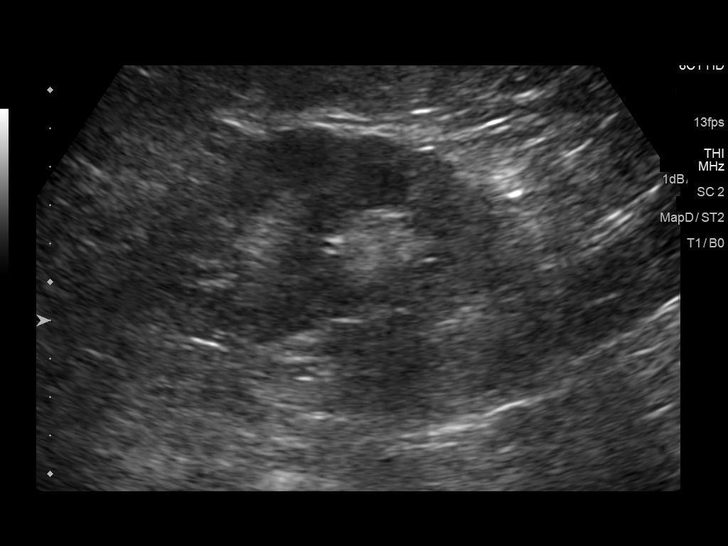
[im 22/35]
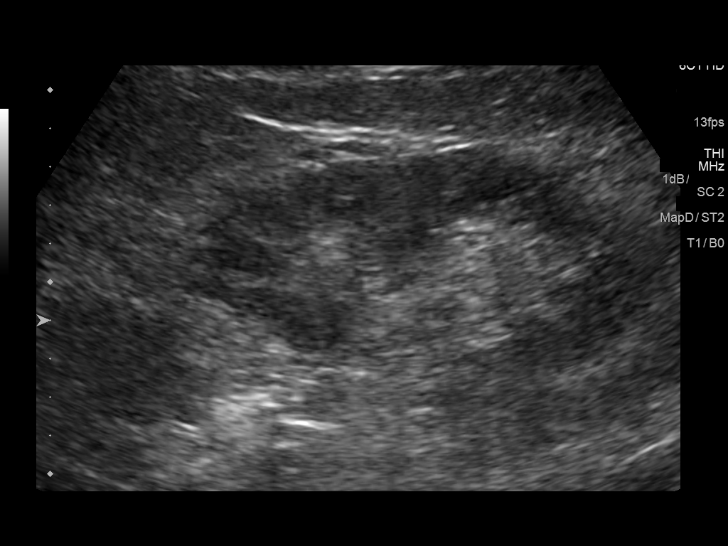
[im 23/35]
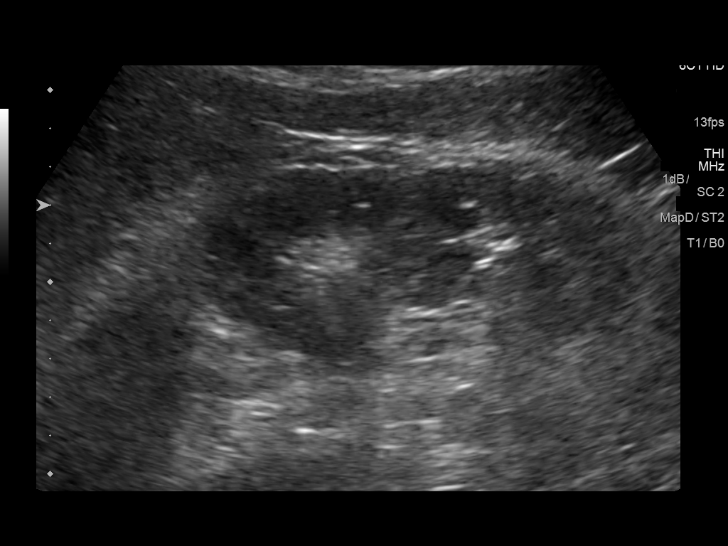
[im 26/35]
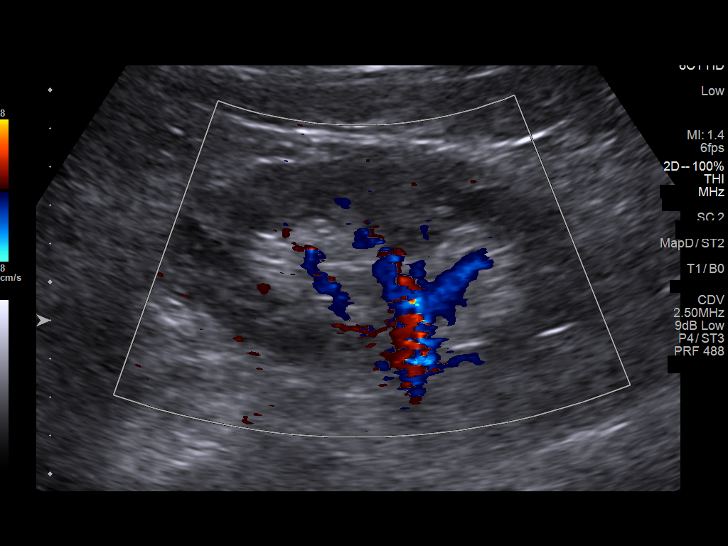
[im 29/35]
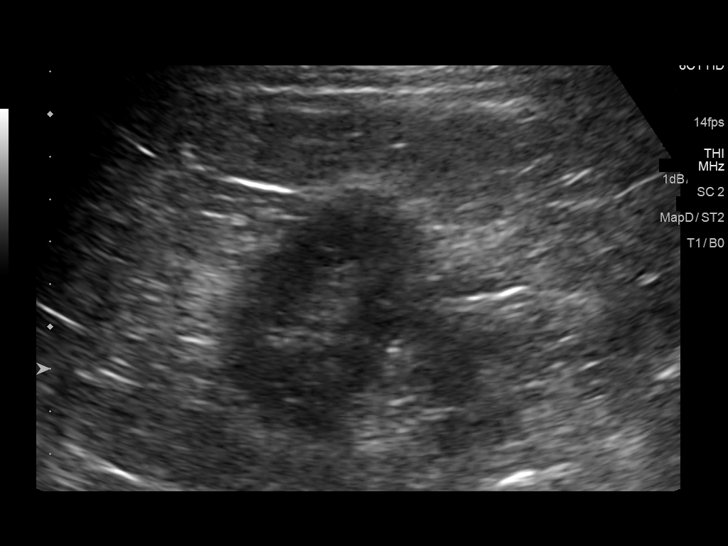
[im 32/35]
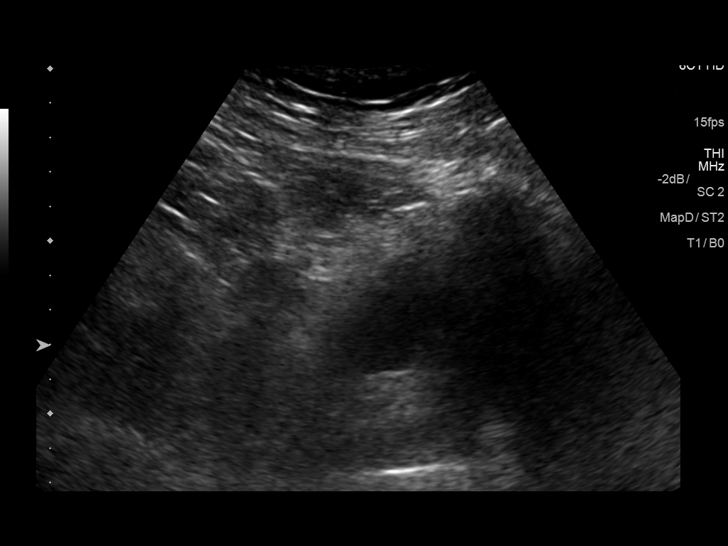
[im 35/35]
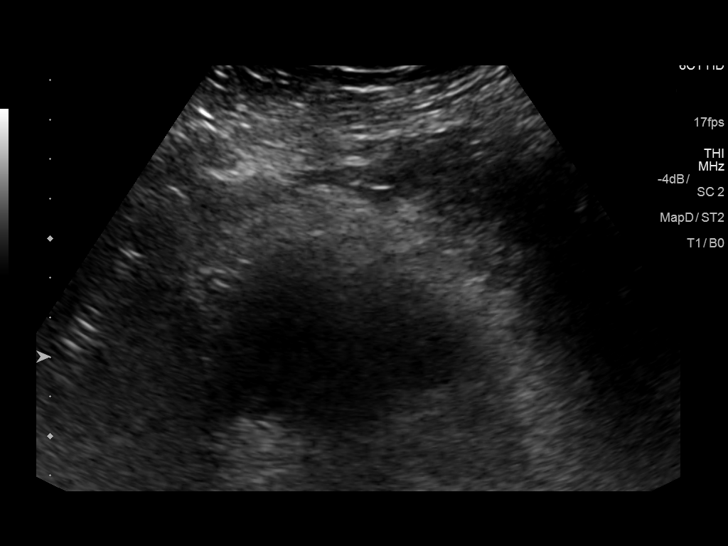

[14 of 25 positions shown; findings below may reference images not displayed]

FINDINGS: Right Kidney:

Length: 11.1 cm. Echogenicity within normal limits. No mass or
hydronephrosis visualized.

Left Kidney:

Length: 12.6 cm. Echogenicity within normal limits. No mass or
hydronephrosis visualized.

Bladder:

Appears normal for degree of bladder distention.
IMPRESSION: Normal renal ultrasound

## 2019-08-21 DIAGNOSIS — R7309 Other abnormal glucose: Secondary | ICD-10-CM | POA: Diagnosis not present

## 2019-11-19 DIAGNOSIS — I10 Essential (primary) hypertension: Secondary | ICD-10-CM | POA: Diagnosis not present

## 2019-11-19 DIAGNOSIS — E1121 Type 2 diabetes mellitus with diabetic nephropathy: Secondary | ICD-10-CM | POA: Diagnosis not present

## 2019-11-19 DIAGNOSIS — E782 Mixed hyperlipidemia: Secondary | ICD-10-CM | POA: Diagnosis not present

## 2019-11-19 DIAGNOSIS — Z23 Encounter for immunization: Secondary | ICD-10-CM | POA: Diagnosis not present

## 2019-11-19 DIAGNOSIS — Z7982 Long term (current) use of aspirin: Secondary | ICD-10-CM | POA: Diagnosis not present

## 2019-12-02 DIAGNOSIS — M109 Gout, unspecified: Secondary | ICD-10-CM | POA: Diagnosis not present

## 2019-12-02 DIAGNOSIS — N1831 Chronic kidney disease, stage 3a: Secondary | ICD-10-CM | POA: Diagnosis not present

## 2019-12-02 DIAGNOSIS — I129 Hypertensive chronic kidney disease with stage 1 through stage 4 chronic kidney disease, or unspecified chronic kidney disease: Secondary | ICD-10-CM | POA: Diagnosis not present

## 2019-12-02 DIAGNOSIS — E1122 Type 2 diabetes mellitus with diabetic chronic kidney disease: Secondary | ICD-10-CM | POA: Diagnosis not present

## 2020-03-01 DIAGNOSIS — E1121 Type 2 diabetes mellitus with diabetic nephropathy: Secondary | ICD-10-CM | POA: Diagnosis not present

## 2020-03-01 DIAGNOSIS — I10 Essential (primary) hypertension: Secondary | ICD-10-CM | POA: Diagnosis not present

## 2020-03-02 DIAGNOSIS — G4733 Obstructive sleep apnea (adult) (pediatric): Secondary | ICD-10-CM | POA: Diagnosis not present

## 2020-03-02 DIAGNOSIS — M109 Gout, unspecified: Secondary | ICD-10-CM | POA: Diagnosis not present

## 2020-03-02 DIAGNOSIS — E1121 Type 2 diabetes mellitus with diabetic nephropathy: Secondary | ICD-10-CM | POA: Diagnosis not present

## 2020-03-02 DIAGNOSIS — K219 Gastro-esophageal reflux disease without esophagitis: Secondary | ICD-10-CM | POA: Diagnosis not present

## 2020-03-02 DIAGNOSIS — E538 Deficiency of other specified B group vitamins: Secondary | ICD-10-CM | POA: Diagnosis not present

## 2020-03-02 DIAGNOSIS — I10 Essential (primary) hypertension: Secondary | ICD-10-CM | POA: Diagnosis not present

## 2020-03-02 DIAGNOSIS — H35039 Hypertensive retinopathy, unspecified eye: Secondary | ICD-10-CM | POA: Diagnosis not present

## 2020-03-02 DIAGNOSIS — N1831 Chronic kidney disease, stage 3a: Secondary | ICD-10-CM | POA: Diagnosis not present

## 2020-03-02 DIAGNOSIS — Z0000 Encounter for general adult medical examination without abnormal findings: Secondary | ICD-10-CM | POA: Diagnosis not present

## 2020-05-31 DIAGNOSIS — E1121 Type 2 diabetes mellitus with diabetic nephropathy: Secondary | ICD-10-CM | POA: Diagnosis not present

## 2020-05-31 DIAGNOSIS — E782 Mixed hyperlipidemia: Secondary | ICD-10-CM | POA: Diagnosis not present

## 2020-05-31 DIAGNOSIS — I10 Essential (primary) hypertension: Secondary | ICD-10-CM | POA: Diagnosis not present

## 2020-06-02 DIAGNOSIS — N1831 Chronic kidney disease, stage 3a: Secondary | ICD-10-CM | POA: Diagnosis not present

## 2020-06-02 DIAGNOSIS — E782 Mixed hyperlipidemia: Secondary | ICD-10-CM | POA: Diagnosis not present

## 2020-06-02 DIAGNOSIS — I10 Essential (primary) hypertension: Secondary | ICD-10-CM | POA: Diagnosis not present

## 2020-06-02 DIAGNOSIS — E1121 Type 2 diabetes mellitus with diabetic nephropathy: Secondary | ICD-10-CM | POA: Diagnosis not present

## 2020-07-06 DIAGNOSIS — G4733 Obstructive sleep apnea (adult) (pediatric): Secondary | ICD-10-CM | POA: Diagnosis not present

## 2020-07-06 DIAGNOSIS — Z9989 Dependence on other enabling machines and devices: Secondary | ICD-10-CM | POA: Diagnosis not present

## 2020-07-06 DIAGNOSIS — M109 Gout, unspecified: Secondary | ICD-10-CM | POA: Diagnosis not present

## 2020-07-06 DIAGNOSIS — N1831 Chronic kidney disease, stage 3a: Secondary | ICD-10-CM | POA: Diagnosis not present

## 2020-07-06 DIAGNOSIS — I129 Hypertensive chronic kidney disease with stage 1 through stage 4 chronic kidney disease, or unspecified chronic kidney disease: Secondary | ICD-10-CM | POA: Diagnosis not present

## 2020-07-06 DIAGNOSIS — E1122 Type 2 diabetes mellitus with diabetic chronic kidney disease: Secondary | ICD-10-CM | POA: Diagnosis not present

## 2020-08-31 DIAGNOSIS — E1121 Type 2 diabetes mellitus with diabetic nephropathy: Secondary | ICD-10-CM | POA: Diagnosis not present

## 2020-08-31 DIAGNOSIS — Z7982 Long term (current) use of aspirin: Secondary | ICD-10-CM | POA: Diagnosis not present

## 2020-08-31 DIAGNOSIS — E782 Mixed hyperlipidemia: Secondary | ICD-10-CM | POA: Diagnosis not present

## 2020-08-31 DIAGNOSIS — N1831 Chronic kidney disease, stage 3a: Secondary | ICD-10-CM | POA: Diagnosis not present

## 2020-09-01 DIAGNOSIS — D508 Other iron deficiency anemias: Secondary | ICD-10-CM | POA: Diagnosis not present

## 2020-09-01 DIAGNOSIS — E782 Mixed hyperlipidemia: Secondary | ICD-10-CM | POA: Diagnosis not present

## 2020-09-01 DIAGNOSIS — E11319 Type 2 diabetes mellitus with unspecified diabetic retinopathy without macular edema: Secondary | ICD-10-CM | POA: Diagnosis not present

## 2020-09-01 DIAGNOSIS — I10 Essential (primary) hypertension: Secondary | ICD-10-CM | POA: Diagnosis not present

## 2020-09-01 DIAGNOSIS — D6489 Other specified anemias: Secondary | ICD-10-CM | POA: Diagnosis not present

## 2020-11-28 DIAGNOSIS — D508 Other iron deficiency anemias: Secondary | ICD-10-CM | POA: Diagnosis not present

## 2020-11-28 DIAGNOSIS — D6489 Other specified anemias: Secondary | ICD-10-CM | POA: Diagnosis not present

## 2020-11-28 DIAGNOSIS — E782 Mixed hyperlipidemia: Secondary | ICD-10-CM | POA: Diagnosis not present

## 2020-11-28 DIAGNOSIS — E1121 Type 2 diabetes mellitus with diabetic nephropathy: Secondary | ICD-10-CM | POA: Diagnosis not present

## 2020-12-01 DIAGNOSIS — E782 Mixed hyperlipidemia: Secondary | ICD-10-CM | POA: Diagnosis not present

## 2020-12-01 DIAGNOSIS — H35039 Hypertensive retinopathy, unspecified eye: Secondary | ICD-10-CM | POA: Diagnosis not present

## 2020-12-01 DIAGNOSIS — D508 Other iron deficiency anemias: Secondary | ICD-10-CM | POA: Diagnosis not present

## 2020-12-01 DIAGNOSIS — Z23 Encounter for immunization: Secondary | ICD-10-CM | POA: Diagnosis not present

## 2020-12-01 DIAGNOSIS — E1121 Type 2 diabetes mellitus with diabetic nephropathy: Secondary | ICD-10-CM | POA: Diagnosis not present

## 2020-12-01 DIAGNOSIS — D72829 Elevated white blood cell count, unspecified: Secondary | ICD-10-CM | POA: Diagnosis not present

## 2020-12-01 DIAGNOSIS — I10 Essential (primary) hypertension: Secondary | ICD-10-CM | POA: Diagnosis not present

## 2020-12-01 DIAGNOSIS — E11319 Type 2 diabetes mellitus with unspecified diabetic retinopathy without macular edema: Secondary | ICD-10-CM | POA: Diagnosis not present

## 2020-12-22 DIAGNOSIS — H524 Presbyopia: Secondary | ICD-10-CM | POA: Diagnosis not present

## 2020-12-22 DIAGNOSIS — H348322 Tributary (branch) retinal vein occlusion, left eye, stable: Secondary | ICD-10-CM | POA: Diagnosis not present

## 2020-12-22 DIAGNOSIS — E113293 Type 2 diabetes mellitus with mild nonproliferative diabetic retinopathy without macular edema, bilateral: Secondary | ICD-10-CM | POA: Diagnosis not present

## 2020-12-22 DIAGNOSIS — H40013 Open angle with borderline findings, low risk, bilateral: Secondary | ICD-10-CM | POA: Diagnosis not present

## 2020-12-22 DIAGNOSIS — H26492 Other secondary cataract, left eye: Secondary | ICD-10-CM | POA: Diagnosis not present

## 2021-03-02 DIAGNOSIS — E782 Mixed hyperlipidemia: Secondary | ICD-10-CM | POA: Diagnosis not present

## 2021-03-02 DIAGNOSIS — E118 Type 2 diabetes mellitus with unspecified complications: Secondary | ICD-10-CM | POA: Diagnosis not present

## 2021-03-02 DIAGNOSIS — I10 Essential (primary) hypertension: Secondary | ICD-10-CM | POA: Diagnosis not present

## 2021-03-06 DIAGNOSIS — M109 Gout, unspecified: Secondary | ICD-10-CM | POA: Diagnosis not present

## 2021-03-06 DIAGNOSIS — Z0000 Encounter for general adult medical examination without abnormal findings: Secondary | ICD-10-CM | POA: Diagnosis not present

## 2021-03-06 DIAGNOSIS — D72829 Elevated white blood cell count, unspecified: Secondary | ICD-10-CM | POA: Diagnosis not present

## 2021-03-06 DIAGNOSIS — E11319 Type 2 diabetes mellitus with unspecified diabetic retinopathy without macular edema: Secondary | ICD-10-CM | POA: Diagnosis not present

## 2021-03-06 DIAGNOSIS — I10 Essential (primary) hypertension: Secondary | ICD-10-CM | POA: Diagnosis not present

## 2021-03-06 DIAGNOSIS — K219 Gastro-esophageal reflux disease without esophagitis: Secondary | ICD-10-CM | POA: Diagnosis not present

## 2021-03-06 DIAGNOSIS — E782 Mixed hyperlipidemia: Secondary | ICD-10-CM | POA: Diagnosis not present

## 2021-03-06 DIAGNOSIS — N1831 Chronic kidney disease, stage 3a: Secondary | ICD-10-CM | POA: Diagnosis not present

## 2021-03-06 DIAGNOSIS — G4733 Obstructive sleep apnea (adult) (pediatric): Secondary | ICD-10-CM | POA: Diagnosis not present

## 2021-03-09 ENCOUNTER — Other Ambulatory Visit: Payer: Self-pay | Admitting: Internal Medicine

## 2021-03-09 DIAGNOSIS — E782 Mixed hyperlipidemia: Secondary | ICD-10-CM

## 2021-04-03 ENCOUNTER — Ambulatory Visit
Admission: RE | Admit: 2021-04-03 | Discharge: 2021-04-03 | Disposition: A | Discharge status: Home / Self Care | Payer: No Typology Code available for payment source | Source: Ambulatory Visit | Attending: Internal Medicine | Admitting: Internal Medicine

## 2021-04-03 DIAGNOSIS — E782 Mixed hyperlipidemia: Secondary | ICD-10-CM

## 2021-04-03 DIAGNOSIS — Z8249 Family history of ischemic heart disease and other diseases of the circulatory system: Secondary | ICD-10-CM | POA: Diagnosis not present

## 2021-04-03 DIAGNOSIS — E785 Hyperlipidemia, unspecified: Secondary | ICD-10-CM | POA: Diagnosis not present

## 2021-04-06 DIAGNOSIS — I2510 Atherosclerotic heart disease of native coronary artery without angina pectoris: Secondary | ICD-10-CM | POA: Diagnosis not present

## 2021-04-06 DIAGNOSIS — Z23 Encounter for immunization: Secondary | ICD-10-CM | POA: Diagnosis not present

## 2021-04-13 ENCOUNTER — Encounter: Payer: Self-pay | Admitting: Adult Health

## 2021-04-13 ENCOUNTER — Ambulatory Visit (INDEPENDENT_AMBULATORY_CARE_PROVIDER_SITE_OTHER): Payer: Medicare Other | Admitting: Adult Health

## 2021-04-13 ENCOUNTER — Other Ambulatory Visit: Payer: Self-pay

## 2021-04-13 DIAGNOSIS — G4733 Obstructive sleep apnea (adult) (pediatric): Secondary | ICD-10-CM | POA: Diagnosis not present

## 2021-04-13 NOTE — Addendum Note (Signed)
Addended by: Vanessa Barbara on: 04/13/2021 05:32 PM ? ? Modules accepted: Orders ? ?

## 2021-04-13 NOTE — Assessment & Plan Note (Signed)
History of severe sleep apnea documented on multiple sleep studies in the past.  Unfortunately patient has not started on CPAP.  He did use this briefly in 2001 but says he did not tolerate.  He had a repeat sleep study in 2019 that showed ongoing severe sleep apnea and nocturnal desaturations.  It has been 4 years since his last sleep study will need to repeat a split-night sleep study. ?Patient education on sleep apnea and importance of treatment went over potential complications of untreated sleep apnea ? ?- discussed how weight can impact sleep and risk for sleep disordered breathing ?- discussed options to assist with weight loss: combination of diet modification, cardiovascular and strength training exercises ?  ?- had an extensive discussion regarding the adverse health consequences related to untreated sleep disordered breathing ?- specifically discussed the risks for hypertension, coronary artery disease, cardiac dysrhythmias, cerebrovascular disease, and diabetes ?- lifestyle modification discussed ?  ?- discussed how sleep disruption can increase risk of accidents, particularly when driving ?- safe driving practices were discussed ?  ? ?Plan  ?Patient Instructions  ?Set up for Split night sleep study .  ?Work on healthy sleep regimen  ?Work on healthy weight loss  ?Do not drive if sleepy  ?Follow up in 6 weeks to discuss results and treatment plan .  ? ?  ? ?

## 2021-04-13 NOTE — Assessment & Plan Note (Signed)
Healthy weight loss discussed 

## 2021-04-13 NOTE — Progress Notes (Signed)
@Patient  ID: Joshua Roberts, male    DOB: 1950-06-19, 71 y.o.   MRN: [id number]  Chief Complaint  Patient presents with   Consult    Referring provider: Merri Brunette, MD  HPI: 71 year old male seen for sleep consult April 13, 2021 for OSA .  Was diagnosed years ago but intolerant to CPAP.  Has daytime sleepiness snoring and restless sleep  TEST/EVENTS :  NSP[facility] 07/25/2017 AHI 74.7/hour, SPO2 low at 74%. 07/25/2017 CPAP titration showed optimal control at 11 cm H2O with a residual AHI at 8.0/hour  Sleep study 2001 AHI 72/hour   04/13/2021 Sleep consult  Patient presents for sleep consult April 13, 2021.  Patient complains of daytime sleepiness, snoring and restless sleep.  Patient was diagnosed with sleep apnea years ago.  In 2001 had a sleep study that showed severe sleep apnea with AHI of 72/hour.  Patient says he tried CPAP briefly but was unable to tolerate.  He was seen by neurology Dr. Vickey Huger underwent a sleep study on August 02, 2017 that showed AHI at 74.7/hour and SPO2 low at 74%, CPAP titration showed optimal control at 11 cm with a residual AHI at 8.0/hour.  He was recommended for CPAP AutoSet 7 to 14 cm.  Patient says he never tried CPAP at this time.  He continues to have ongoing daytime sleepiness restless sleep and snoring.  He was referred over by his primary care provider since he has multiple comorbidities Epworth score is 15.  Typically gets sleepy if he is an active such as sitting watching TV.  And after eating.  Some mild sleepiness with driving. No suspicious symptoms for cataplexy or sleep paralysis. Caffeine intake drinks 3 sodas daily. Typically goes to bed about midnight.  Takes about 20 minutes to go to sleep.  Is up 4 times at night.  Up at 930.  His weight is up about few pounds over the last couple years.  Patient is retired.  Does not operate heavy machinery.  Patient said he is sedentary does not exercise.  Medical history positive for hypertension, childhood  asthma, type 2 diabetes, hyperlipidemia.,  History of prostate cancer.,  Chronic kidney disease.,  Gout  Negative for congestive heart failure or stroke.  Surgical history positive for colon resection for colon polyp that was benign.  Prostate cancer status post radiation and seed implantation.,  Sinus surgery age 69.  Social history patient lives alone.  He is divorced.  No children.  He is retired from the [employer].  Was a previous Emergency planning/management officer.  He is a never smoker.  Rare alcohol use.  No drug use.  Family history positive for diabetes and heart disease.   No Known Allergies  Immunization History  Administered Date(s) Administered   Influenza, Quadrivalent, Recombinant, Inj, Pf 11/27/2016, 12/05/2017, 10/17/2018, 11/19/2019, 12/01/2020   Influenza-Unspecified 12/06/2017   Moderna Sars-Covid-2 Vaccination 03/24/2019, 04/21/2019, 08/24/2020   Pneumococcal Polysaccharide-23 06/27/2010   Tdap 02/11/2017   Zoster Recombinat (Shingrix) 01/05/2021, 03/06/2021    Past Medical History:  Diagnosis Date   Abnormal EKG    right bbb, left fasicular block since 2014   Chronic kidney disease    ckd stage 2   DM type 2 (diabetes mellitus, type 2) (HCC)    Elevated PSA    GERD (gastroesophageal reflux disease)    GI bleed 2015   Gout    History of kidney stones 2002 and 2006   Hypercholesteremia    Hypertension    Macular degeneration  Prostate cancer (HCC)    Sleep apnea    does not have cpap yet    Tobacco History: Social History   Tobacco Use  Smoking Status Never   Passive exposure: Past  Smokeless Tobacco Never   Counseling given: Not Answered   Outpatient Medications Prior to Visit  Medication Sig Dispense Refill   allopurinol (ZYLOPRIM) 300 MG tablet Take 300 mg by mouth at bedtime.      amLODipine-olmesartan (AZOR) 10-40 MG tablet Take 1 tablet by mouth at bedtime.      aspirin 81 MG tablet Take 81 mg by mouth daily.     BYSTOLIC 20 MG TABS at bedtime.       ferrous sulfate 325 (65 FE) MG tablet TAKE ONE TABLET BY MOUTH DAILY PER DR ROLENS     LORazepam (ATIVAN) 0.5 MG tablet Take 0.5 mg by mouth 3 times/day as needed-between meals & bedtime for anxiety.     omeprazole (PRILOSEC) 20 MG capsule Take 20 mg by mouth at bedtime.      ONETOUCH VERIO test strip AS DIRECTED TO CHECK BLOOD GLUCOSE TWICE DAILY. E11.65 IN VITRO 90 DAYS     OZEMPIC, 0.25 OR 0.5 MG/DOSE, 2 MG/1.5ML SOPN thursdays     pravastatin (PRAVACHOL) 80 MG tablet Take 80 mg by mouth at bedtime.      sertraline (ZOLOFT) 50 MG tablet Take 50 mg by mouth daily.     Cyanocobalamin (VITAMIN B 12 PO) Take by mouth. (Patient not taking: Reported on 04/13/2021)     VITAMIN D, CHOLECALCIFEROL, PO Take by mouth daily. (Patient not taking: Reported on 04/13/2021)     metFORMIN (GLUCOPHAGE-XR) 500 MG 24 hr tablet Take 1 tablet by mouth every evening.  (Patient not taking: Reported on 04/13/2021)  3   No facility-administered medications prior to visit.     Review of Systems:   Constitutional:   No  weight loss, night sweats,  Fevers, chills,  +fatigue, or  lassitude.  HEENT:   No headaches,  Difficulty swallowing,  Tooth/dental problems, or  Sore throat,                No sneezing, itching, ear ache, nasal congestion, post nasal drip,   CV:  No chest pain,  Orthopnea, PND, swelling in lower extremities, anasarca, dizziness, palpitations, syncope.   GI  No heartburn, indigestion, abdominal pain, nausea, vomiting, diarrhea, change in bowel habits, loss of appetite, bloody stools.   Resp: No shortness of breath with exertion or at rest.  No excess mucus, no productive cough,  No non-productive cough,  No coughing up of blood.  No change in color of mucus.  No wheezing.  No chest wall deformity  Skin: no rash or lesions.  GU: no dysuria, change in color of urine, no urgency or frequency.  No flank pain, no hematuria   MS:  No joint pain or swelling.  No decreased range of motion.  No back  pain.    Physical Exam  BP 110/60 (BP Location: Left Arm, Patient Position: Sitting, Cuff Size: Large)   Pulse 78   Temp 98.3 F (36.8 C) (Oral)   Ht 5\' 3"  (1.6 m)   Wt 267 lb 9.6 oz (121.4 kg)   SpO2 97%   BMI 47.40 kg/m   GEN: A/Ox3; pleasant , NAD, well nourished, BMI 47   HEENT:  [place]/AT,  E NOSE-clear, THROAT-clear, no lesions, no postnasal drip or exudate noted.  Class II-III MP airway  NECK:  Supple  w/ fair ROM; no JVD; normal carotid impulses w/o bruits; no thyromegaly or nodules palpated; no lymphadenopathy.    RESP  Clear  P & A; w/o, wheezes/ rales/ or rhonchi. no accessory muscle use, no dullness to percussion  CARD:  RRR, no m/r/g, no peripheral edema, pulses intact, no cyanosis or clubbing.  GI:   Soft & nt; nml bowel sounds; no organomegaly or masses detected.   Musco: Warm bil, no deformities or joint swelling noted.   Neuro: alert, no focal deficits noted.    Skin: Warm, no lesions or rashes    Lab Results:  CBC     BNP No results found for: BNP  ProBNP No results found for: PROBNP  Imaging:    No flowsheet data found.  No results found for: NITRICOXIDE      Assessment & Plan:   Obstructive sleep apnea:  History of severe sleep apnea documented on multiple sleep studies in the past.  Unfortunately patient has not started on CPAP.  He did use this briefly in 2001 but says he did not tolerate.  He had a repeat sleep study in 2019 that showed ongoing severe sleep apnea and nocturnal desaturations.  It has been 4 years since his last sleep study will need to repeat a split-night sleep study. Patient education on sleep apnea and importance of treatment went over potential complications of untreated sleep apnea  - discussed how weight can impact sleep and risk for sleep disordered breathing - discussed options to assist with weight loss: combination of diet modification, cardiovascular and strength training exercises   - had an extensive  discussion regarding the adverse health consequences related to untreated sleep disordered breathing - specifically discussed the risks for hypertension, coronary artery disease, cardiac dysrhythmias, cerebrovascular disease, and diabetes - lifestyle modification discussed   - discussed how sleep disruption can increase risk of accidents, particularly when driving - safe driving practices were discussed    Plan  Patient Instructions  Set up for Split night sleep study .  Work on healthy sleep regimen  Work on healthy weight loss  Do not drive if sleepy  Follow up in 6 weeks to discuss results and treatment plan .       Obesity, Class III, BMI 40-49.9 (morbid obesity) Healthy weight loss discussed     Rubye Oaks, NP 04/13/2021

## 2021-04-13 NOTE — Patient Instructions (Signed)
Set up for Split night sleep study .  ?Work on healthy sleep regimen  ?Work on healthy weight loss  ?Do not drive if sleepy  ?Follow up in 6 weeks to discuss results and treatment plan .  ? ?

## 2021-04-14 NOTE — Progress Notes (Signed)
Reviewed and agree with assessment/plan. ? ? ?Chesley Mires, MD ?[place] ?04/14/2021, 8:32 AM ?Pager:  [phone] ? ?

## 2021-05-19 ENCOUNTER — Ambulatory Visit (HOSPITAL_BASED_OUTPATIENT_CLINIC_OR_DEPARTMENT_OTHER): Payer: Medicare Other | Attending: Adult Health | Admitting: Pulmonary Disease

## 2021-05-19 DIAGNOSIS — Z6841 Body Mass Index (BMI) 40.0 and over, adult: Secondary | ICD-10-CM | POA: Insufficient documentation

## 2021-05-19 DIAGNOSIS — G4733 Obstructive sleep apnea (adult) (pediatric): Secondary | ICD-10-CM | POA: Diagnosis not present

## 2021-05-24 DIAGNOSIS — G4733 Obstructive sleep apnea (adult) (pediatric): Secondary | ICD-10-CM | POA: Diagnosis not present

## 2021-05-24 NOTE — Procedures (Signed)
? ? ?  Patient Name: Joshua Roberts, Joshua Roberts ?Study Date: 05/19/2021 ?Gender: Male ?D.O.B: 03/08/1950 ?Age (years): 35 ?Referring Provider: Rexene Edison ?Height (inches): 67 ?Interpreting Physician: Chesley Mires MD, ABSM ?Weight (lbs): 265 ?RPSGT: Jorge Ny ?BMI: 42 ?MRN: [id number] ?Neck Size: 20.00 ? ?CLINICAL INFORMATION ?Sleep Study Type: NPSG ? ?Indication for sleep study: Diabetes, Hypertension, OSA, Re-Evaluation ? ?Epworth Sleepiness Score: 12 ? ?SLEEP STUDY TECHNIQUE ?As per the AASM Manual for the Scoring of Sleep and Associated Events v2.3 (April 2016) with a hypopnea requiring 4% desaturations. ? ?The channels recorded and monitored were frontal, central and occipital EEG, electrooculogram (EOG), submentalis EMG (chin), nasal and oral airflow, thoracic and abdominal wall motion, anterior tibialis EMG, snore microphone, electrocardiogram, and pulse oximetry. ? ?MEDICATIONS ?Medications self-administered by patient taken the night of the study : N/A ? ?SLEEP ARCHITECTURE ?The study was initiated at 10:47:08 PM and ended at 4:43:28 AM. ? ?Sleep onset time was 12.7 minutes and the sleep efficiency was 57.1%%. The total sleep time was 203.6 minutes.  He had trouble with sleep initiation and sleep maintenance due to respiratory events. ? ?Stage REM latency was 34.0 minutes. ? ?The patient spent 23.8%% of the night in stage N1 sleep, 58.5%% in stage N2 sleep, 0.0%% in stage N3 and 17.7% in REM. ? ?Alpha intrusion was absent. ? ?Supine sleep was 78.15%. ? ?RESPIRATORY PARAMETERS ?The overall apnea/hypopnea index (AHI) was 84.3 per hour. There were 2 total apneas, including 0 obstructive, 2 central and 0 mixed apneas. There were 284 hypopneas and 2 RERAs. ? ?The AHI during Stage REM sleep was 73.3 per hour. ? ?AHI while supine was 83.7 per hour. ? ?The mean oxygen saturation was 89.7%. The minimum SpO2 during sleep was 74.0%. ? ?Moderate snoring was noted during this study. ? ?CARDIAC DATA ?The 2 lead EKG  demonstrated sinus rhythm. The mean heart rate was 61.1 beats per minute. Other EKG findings include: None. ? ?LEG MOVEMENT DATA ?The total PLMS were 0 with a resulting PLMS index of 0.0. Associated arousal with leg movement index was 0.0 . ? ?IMPRESSIONS ?- Severe obstructive sleep apnea occurred during this study (AHI = 84.3/h). ?- Moderate oxygen desaturation was noted during this study (Min O2 = 74.0%). ?- The patient snored with moderate snoring volume. ? ?DIAGNOSIS ?- Obstructive Sleep Apnea (G47.33) ? ?RECOMMENDATIONS ?- Given the severity of his sleep apnea he should be tried on CPAP therapy first. ?- Alternative therapies include oral appliance or surgical assessment. ?- Avoid alcohol, sedatives and other CNS depressants that may worsen sleep apnea and disrupt normal sleep architecture. ?- Sleep hygiene should be reviewed to assess factors that may improve sleep quality. ?- Weight management and regular exercise should be initiated or continued if appropriate. ? ?[Electronically signed] 05/24/2021 08:39 AM ? ?Chesley Mires MD, ABSM ?Diplomate, Tax adviser of Sleep Medicine ?NPI: [id number] ? ?[place] ?PH: [phone]   FX: [phone] ?ACCREDITED BY THE AMERICAN ACADEMY OF SLEEP MEDICINE ? ?

## 2021-05-25 ENCOUNTER — Encounter: Payer: Self-pay | Admitting: Adult Health

## 2021-05-25 ENCOUNTER — Telehealth: Payer: Self-pay | Admitting: *Deleted

## 2021-05-25 ENCOUNTER — Ambulatory Visit (INDEPENDENT_AMBULATORY_CARE_PROVIDER_SITE_OTHER): Payer: Medicare Other | Admitting: Adult Health

## 2021-05-25 DIAGNOSIS — E782 Mixed hyperlipidemia: Secondary | ICD-10-CM | POA: Diagnosis not present

## 2021-05-25 DIAGNOSIS — G4733 Obstructive sleep apnea (adult) (pediatric): Secondary | ICD-10-CM | POA: Diagnosis not present

## 2021-05-25 DIAGNOSIS — I10 Essential (primary) hypertension: Secondary | ICD-10-CM | POA: Diagnosis not present

## 2021-05-25 DIAGNOSIS — E118 Type 2 diabetes mellitus with unspecified complications: Secondary | ICD-10-CM | POA: Diagnosis not present

## 2021-05-25 NOTE — Patient Instructions (Signed)
Begin CPAP At bedtime  wear all night long for at least 6hr each night ?Work on healthy weight loss  ?Do not drive if sleepy  ?Follow up in 3 months and As needed   ? ?

## 2021-05-25 NOTE — Assessment & Plan Note (Addendum)
Morbid obesity.  Healthy weight loss discussed ?

## 2021-05-25 NOTE — Telephone Encounter (Signed)
Called the [facility] regarding the Split Night Sleep Study that was not performed on 05/19/2021.  I let them know that we ordered a Split Night Sleep Study and a Sleep Study was done instead and we are trying to determine why this was done.  She looked and stated that she saw that the sleep study was done and asked what we wanted done.  I advised her that the wrong test was done that we ordered the Split Night because he had bad sleep apnea and he would have been put on CPAP and converted to Bipap if necessary.  She stated she would get in touch with Lynnae Sandhoff and call me back. ?

## 2021-05-25 NOTE — Assessment & Plan Note (Signed)
Severe obstructive sleep apnea.  Long discussion with patient regarding importance of managing his underlying severe sleep apnea.  Patient education on sleep apnea and potential complications ?- discussed how weight can impact sleep and risk for sleep disordered breathing ?- discussed options to assist with weight loss: combination of diet modification, cardiovascular and strength training exercises ?  ?- had an extensive discussion regarding the adverse health consequences related to untreated sleep disordered breathing ?- specifically discussed the risks for hypertension, coronary artery disease, cardiac dysrhythmias, cerebrovascular disease, and diabetes ?- lifestyle modification discussed ?  ?- discussed how sleep disruption can increase risk of accidents, particularly when driving ?- safe driving practices were discussed ? ?Patient will begin CPAP at bedtime auto CPAP 5 to 20 cm H2O. ? ?   ?Plan  ?Patient Instructions  ?Begin CPAP At bedtime  wear all night long for at least 6hr each night ?Work on healthy weight loss  ?Do not drive if sleepy  ?Follow up in 3 months and As needed   ? ?  ? ?

## 2021-05-25 NOTE — Progress Notes (Signed)
? ?'@Patient'$  ID: Joshua Roberts, male    DOB: 1950-12-24, 71 y.o.   MRN: [id number] ? ?Chief Complaint  ?Patient presents with  ? Follow-up  ? ? ?Referring provider: ?Deland Pretty, MD ? ?HPI: ?71 year old male seen for sleep consult April 13, 2021 to establish for sleep apnea ?[place] , gets meds through [place]  ? ?TEST/EVENTS :  ?NSPG 07/25/2017 AHI 74.7/hour, SPO2 low at 74%. ?07/25/2017 CPAP titration showed optimal control at 11 cm H2O with a residual AHI at 8.0/hour ?  ?Sleep study 2001 AHI 72/hour ? ?05/25/2021 Follow up : OSA  ?Patient returns for a follow-up visit.  Patient was seen last visit April 13, 2021 for sleep consult to establish for sleep apnea.  Patient has had diagnosis sleep apnea for years.  Unfortunately he was not able to tolerate CPAP in the past.   He has ongoing daytime sleepiness and snoring.  Patient was set up for a split-night sleep study.  That was completed on May 19, 2021 that showed very severe sleep apnea with AHI at 84/hour and SPO2 low at 74%.  Titration portion of the sleep study was not completed . We went over his sleep study results in detail.  We went over treatment options including weight loss and CPAP therapy.  Patient will begin on CPAP.  Patient education on CPAP care. ? ? ? ? ?No Known Allergies ? ?Immunization History  ?Administered Date(s) Administered  ? Influenza, Quadrivalent, Recombinant, Inj, Pf 11/27/2016, 12/05/2017, 10/17/2018, 11/19/2019, 12/01/2020  ? Influenza-Unspecified 12/06/2017  ? Moderna Sars-Covid-2 Vaccination 03/24/2019, 04/21/2019, 08/24/2020  ? Pneumococcal Polysaccharide-23 06/27/2010  ? Tdap 02/11/2017  ? Zoster Recombinat (Shingrix) 01/05/2021, 03/06/2021  ? ? ?Past Medical History:  ?Diagnosis Date  ? Abnormal EKG   ? right bbb, left fasicular block since 2014  ? Chronic kidney disease   ? ckd stage 2  ? DM type 2 (diabetes mellitus, type 2) ([place])   ? Elevated PSA   ? GERD (gastroesophageal reflux disease)   ? GI bleed 2015  ? Gout   ? History  of kidney stones 2002 and 2006  ? Hypercholesteremia   ? Hypertension   ? Macular degeneration   ? Prostate cancer ([place])   ? Sleep apnea   ? does not have cpap yet  ? ? ?Tobacco History: ?Social History  ? ?Tobacco Use  ?Smoking Status Never  ? Passive exposure: Past  ?Smokeless Tobacco Never  ? ?Counseling given: Not Answered ? ? ?Outpatient Medications Prior to Visit  ?Medication Sig Dispense Refill  ? allopurinol (ZYLOPRIM) 300 MG tablet Take 300 mg by mouth at bedtime.     ? amLODipine-olmesartan (AZOR) 10-40 MG tablet Take 1 tablet by mouth at bedtime.     ? aspirin 81 MG tablet Take 81 mg by mouth daily.    ? BYSTOLIC 20 MG TABS at bedtime.     ? ferrous sulfate 325 (65 FE) MG tablet TAKE ONE TABLET BY MOUTH DAILY PER DR ROLENS    ? omeprazole (PRILOSEC) 20 MG capsule Take 20 mg by mouth at bedtime.     ? ONETOUCH VERIO test strip AS DIRECTED TO CHECK BLOOD GLUCOSE TWICE DAILY. E11.65 IN VITRO 90 DAYS    ? OZEMPIC, 0.25 OR 0.5 MG/DOSE, 2 MG/1.5ML SOPN thursdays    ? pravastatin (PRAVACHOL) 80 MG tablet Take 80 mg by mouth at bedtime.     ? sertraline (ZOLOFT) 50 MG tablet Take 50 mg by mouth daily.    ? Cyanocobalamin (  VITAMIN B 12 PO) Take by mouth. (Patient not taking: Reported on 04/13/2021)    ? LORazepam (ATIVAN) 0.5 MG tablet Take 0.5 mg by mouth 3 times/day as needed-between meals & bedtime for anxiety. (Patient not taking: Reported on 05/25/2021)    ? Multiple Vitamin (MULTIVITAMIN) tablet Take 1 tablet by mouth daily. (Patient not taking: Reported on 05/25/2021)    ? VITAMIN D, CHOLECALCIFEROL, PO Take by mouth daily. (Patient not taking: Reported on 04/13/2021)    ? ?No facility-administered medications prior to visit.  ? ? ? ?Review of Systems:  ? ?Constitutional:   No  weight loss, night sweats,  Fevers, chills,  ?+fatigue, or  lassitude. ? ?HEENT:   No headaches,  Difficulty swallowing,  Tooth/dental problems, or  Sore throat,  ?              No sneezing, itching, ear ache, nasal congestion, post nasal  drip,  ? ?CV:  No chest pain,  Orthopnea, PND, swelling in lower extremities, anasarca, dizziness, palpitations, syncope.  ? ?GI  No heartburn, indigestion, abdominal pain, nausea, vomiting, diarrhea, change in bowel habits, loss of appetite, bloody stools.  ? ?Resp: No shortness of breath with exertion or at rest.  No excess mucus, no productive cough,  No non-productive cough,  No coughing up of blood.  No change in color of mucus.  No wheezing.  No chest wall deformity ? ?Skin: no rash or lesions. ? ?GU: no dysuria, change in color of urine, no urgency or frequency.  No flank pain, no hematuria  ? ?MS:  No joint pain or swelling.  No decreased range of motion.  No back pain. ? ? ? ?Physical Exam ? ?BP 126/70 (BP Location: Left Arm, Patient Position: Sitting, Cuff Size: Large)   Pulse 73   Temp 98 ?F (36.7 ?C) (Oral)   Ht '5\' 7"'$  (1.702 m)   Wt 265 lb 12.8 oz (120.6 kg)   SpO2 96%   BMI 41.63 kg/m?  ? ?GEN: A/Ox3; pleasant , NAD, well nourished  ?  ?HEENT:  [place]/AT,   NOSE-clear, THROAT-clear, no lesions, no postnasal drip or exudate noted. Class 3 MP airway  ? ?NECK:  Supple w/ fair ROM; no JVD; normal carotid impulses w/o bruits; no thyromegaly or nodules palpated; no lymphadenopathy.   ? ?RESP  Clear  P & A; w/o, wheezes/ rales/ or rhonchi. no accessory muscle use, no dullness to percussion ? ?CARD:  RRR, no m/r/g, no peripheral edema, pulses intact, no cyanosis or clubbing. ? ?GI:   Soft & nt; nml bowel sounds; no organomegaly or masses detected.  ? ?Musco: Warm bil, no deformities or joint swelling noted.  ? ?Neuro: alert, no focal deficits noted.   ? ?Skin: Warm, no lesions or rashes ? ? ? ?Lab Results: ? ? ? ? ? ?BNP ?No results found for: BNP ? ?ProBNP ?No results found for: PROBNP ? ?Imaging: ?SLEEP STUDY DOCUMENTS ? ?Result Date: 05/23/2021 ?Ordered by an unspecified provider.  ? ? ? ?   ? View : No data to display.  ?  ?  ?  ? ? ?No results found for: NITRICOXIDE ? ? ? ? ? ?Assessment & Plan:  ? ?OSA  (obstructive sleep apnea) ?Severe obstructive sleep apnea.  Long discussion with patient regarding importance of managing his underlying severe sleep apnea.  Patient education on sleep apnea and potential complications ?- discussed how weight can impact sleep and risk for sleep disordered breathing ?- discussed options to assist with  weight loss: combination of diet modification, cardiovascular and strength training exercises ?  ?- had an extensive discussion regarding the adverse health consequences related to untreated sleep disordered breathing ?- specifically discussed the risks for hypertension, coronary artery disease, cardiac dysrhythmias, cerebrovascular disease, and diabetes ?- lifestyle modification discussed ?  ?- discussed how sleep disruption can increase risk of accidents, particularly when driving ?- safe driving practices were discussed ? ?Patient will begin CPAP at bedtime auto CPAP 5 to 20 cm H2O. ? ?   ?Plan  ?Patient Instructions  ?Begin CPAP At bedtime  wear all night long for at least 6hr each night ?Work on healthy weight loss  ?Do not drive if sleepy  ?Follow up in 3 months and As needed   ? ?  ? ? ?Obesity, Class III, BMI 40-49.9 (morbid obesity) ?Morbid obesity.  Healthy weight loss discussed ? ? ? ?Rexene Edison, NP ?05/25/2021 ? ?

## 2021-05-25 NOTE — Addendum Note (Signed)
Addended by: Vanessa Barbara on: 05/25/2021 03:25 PM ? ? Modules accepted: Orders ? ?

## 2021-05-26 NOTE — Progress Notes (Signed)
Reviewed and agree with assessment/plan. ? ? ?Chesley Mires, MD ?[place] ?05/26/2021, 7:04 AM ?Pager:  [phone] ? ?

## 2021-06-20 DIAGNOSIS — E1121 Type 2 diabetes mellitus with diabetic nephropathy: Secondary | ICD-10-CM | POA: Diagnosis not present

## 2021-06-20 DIAGNOSIS — E782 Mixed hyperlipidemia: Secondary | ICD-10-CM | POA: Diagnosis not present

## 2021-06-20 DIAGNOSIS — I10 Essential (primary) hypertension: Secondary | ICD-10-CM | POA: Diagnosis not present

## 2021-07-06 DIAGNOSIS — N209 Urinary calculus, unspecified: Secondary | ICD-10-CM | POA: Diagnosis not present

## 2021-07-06 DIAGNOSIS — I129 Hypertensive chronic kidney disease with stage 1 through stage 4 chronic kidney disease, or unspecified chronic kidney disease: Secondary | ICD-10-CM | POA: Diagnosis not present

## 2021-07-06 DIAGNOSIS — M109 Gout, unspecified: Secondary | ICD-10-CM | POA: Diagnosis not present

## 2021-07-06 DIAGNOSIS — E1122 Type 2 diabetes mellitus with diabetic chronic kidney disease: Secondary | ICD-10-CM | POA: Diagnosis not present

## 2021-07-06 DIAGNOSIS — D631 Anemia in chronic kidney disease: Secondary | ICD-10-CM | POA: Diagnosis not present

## 2021-07-06 DIAGNOSIS — N2581 Secondary hyperparathyroidism of renal origin: Secondary | ICD-10-CM | POA: Diagnosis not present

## 2021-07-06 DIAGNOSIS — Z79899 Other long term (current) drug therapy: Secondary | ICD-10-CM | POA: Diagnosis not present

## 2021-07-06 DIAGNOSIS — E785 Hyperlipidemia, unspecified: Secondary | ICD-10-CM | POA: Diagnosis not present

## 2021-07-06 DIAGNOSIS — N1831 Chronic kidney disease, stage 3a: Secondary | ICD-10-CM | POA: Diagnosis not present

## 2021-07-06 DIAGNOSIS — G4733 Obstructive sleep apnea (adult) (pediatric): Secondary | ICD-10-CM | POA: Diagnosis not present

## 2021-09-26 DIAGNOSIS — I10 Essential (primary) hypertension: Secondary | ICD-10-CM | POA: Diagnosis not present

## 2021-09-26 DIAGNOSIS — E1121 Type 2 diabetes mellitus with diabetic nephropathy: Secondary | ICD-10-CM | POA: Diagnosis not present

## 2021-09-26 DIAGNOSIS — E7800 Pure hypercholesterolemia, unspecified: Secondary | ICD-10-CM | POA: Diagnosis not present

## 2021-10-03 DIAGNOSIS — E1121 Type 2 diabetes mellitus with diabetic nephropathy: Secondary | ICD-10-CM | POA: Diagnosis not present

## 2021-10-03 DIAGNOSIS — N1831 Chronic kidney disease, stage 3a: Secondary | ICD-10-CM | POA: Diagnosis not present

## 2021-10-03 DIAGNOSIS — I10 Essential (primary) hypertension: Secondary | ICD-10-CM | POA: Diagnosis not present

## 2021-10-03 DIAGNOSIS — E782 Mixed hyperlipidemia: Secondary | ICD-10-CM | POA: Diagnosis not present

## 2021-12-27 DIAGNOSIS — E782 Mixed hyperlipidemia: Secondary | ICD-10-CM | POA: Diagnosis not present

## 2021-12-27 DIAGNOSIS — I10 Essential (primary) hypertension: Secondary | ICD-10-CM | POA: Diagnosis not present

## 2021-12-27 DIAGNOSIS — E1121 Type 2 diabetes mellitus with diabetic nephropathy: Secondary | ICD-10-CM | POA: Diagnosis not present

## 2022-01-03 DIAGNOSIS — E782 Mixed hyperlipidemia: Secondary | ICD-10-CM | POA: Diagnosis not present

## 2022-01-03 DIAGNOSIS — N1831 Chronic kidney disease, stage 3a: Secondary | ICD-10-CM | POA: Diagnosis not present

## 2022-01-03 DIAGNOSIS — E1121 Type 2 diabetes mellitus with diabetic nephropathy: Secondary | ICD-10-CM | POA: Diagnosis not present

## 2022-01-03 DIAGNOSIS — I10 Essential (primary) hypertension: Secondary | ICD-10-CM | POA: Diagnosis not present

## 2022-01-11 DIAGNOSIS — N2581 Secondary hyperparathyroidism of renal origin: Secondary | ICD-10-CM | POA: Diagnosis not present

## 2022-01-11 DIAGNOSIS — I129 Hypertensive chronic kidney disease with stage 1 through stage 4 chronic kidney disease, or unspecified chronic kidney disease: Secondary | ICD-10-CM | POA: Diagnosis not present

## 2022-01-11 DIAGNOSIS — D631 Anemia in chronic kidney disease: Secondary | ICD-10-CM | POA: Diagnosis not present

## 2022-01-11 DIAGNOSIS — N1831 Chronic kidney disease, stage 3a: Secondary | ICD-10-CM | POA: Diagnosis not present

## 2022-01-11 DIAGNOSIS — M109 Gout, unspecified: Secondary | ICD-10-CM | POA: Diagnosis not present

## 2022-01-11 DIAGNOSIS — G4733 Obstructive sleep apnea (adult) (pediatric): Secondary | ICD-10-CM | POA: Diagnosis not present

## 2022-01-11 DIAGNOSIS — E1122 Type 2 diabetes mellitus with diabetic chronic kidney disease: Secondary | ICD-10-CM | POA: Diagnosis not present

## 2022-01-26 DIAGNOSIS — H40013 Open angle with borderline findings, low risk, bilateral: Secondary | ICD-10-CM | POA: Diagnosis not present

## 2022-01-26 DIAGNOSIS — E119 Type 2 diabetes mellitus without complications: Secondary | ICD-10-CM | POA: Diagnosis not present

## 2022-01-26 DIAGNOSIS — H26492 Other secondary cataract, left eye: Secondary | ICD-10-CM | POA: Diagnosis not present

## 2022-01-26 DIAGNOSIS — H348322 Tributary (branch) retinal vein occlusion, left eye, stable: Secondary | ICD-10-CM | POA: Diagnosis not present

## 2022-01-26 DIAGNOSIS — H524 Presbyopia: Secondary | ICD-10-CM | POA: Diagnosis not present

## 2022-02-13 ENCOUNTER — Encounter (INDEPENDENT_AMBULATORY_CARE_PROVIDER_SITE_OTHER): Payer: Medicare Other | Admitting: Ophthalmology

## 2022-02-13 DIAGNOSIS — H35033 Hypertensive retinopathy, bilateral: Secondary | ICD-10-CM

## 2022-02-13 DIAGNOSIS — H43813 Vitreous degeneration, bilateral: Secondary | ICD-10-CM

## 2022-02-13 DIAGNOSIS — I10 Essential (primary) hypertension: Secondary | ICD-10-CM | POA: Diagnosis not present

## 2022-02-13 DIAGNOSIS — H348322 Tributary (branch) retinal vein occlusion, left eye, stable: Secondary | ICD-10-CM | POA: Diagnosis not present

## 2022-03-08 DIAGNOSIS — Z23 Encounter for immunization: Secondary | ICD-10-CM | POA: Diagnosis not present

## 2022-03-08 DIAGNOSIS — G4733 Obstructive sleep apnea (adult) (pediatric): Secondary | ICD-10-CM | POA: Diagnosis not present

## 2022-03-08 DIAGNOSIS — N1831 Chronic kidney disease, stage 3a: Secondary | ICD-10-CM | POA: Diagnosis not present

## 2022-03-08 DIAGNOSIS — K219 Gastro-esophageal reflux disease without esophagitis: Secondary | ICD-10-CM | POA: Diagnosis not present

## 2022-03-08 DIAGNOSIS — C61 Malignant neoplasm of prostate: Secondary | ICD-10-CM | POA: Diagnosis not present

## 2022-03-08 DIAGNOSIS — I7121 Aneurysm of the ascending aorta, without rupture: Secondary | ICD-10-CM | POA: Diagnosis not present

## 2022-03-08 DIAGNOSIS — I10 Essential (primary) hypertension: Secondary | ICD-10-CM | POA: Diagnosis not present

## 2022-03-08 DIAGNOSIS — I2510 Atherosclerotic heart disease of native coronary artery without angina pectoris: Secondary | ICD-10-CM | POA: Diagnosis not present

## 2022-03-08 DIAGNOSIS — E1121 Type 2 diabetes mellitus with diabetic nephropathy: Secondary | ICD-10-CM | POA: Diagnosis not present

## 2022-03-08 DIAGNOSIS — Z0000 Encounter for general adult medical examination without abnormal findings: Secondary | ICD-10-CM | POA: Diagnosis not present

## 2022-03-08 DIAGNOSIS — M109 Gout, unspecified: Secondary | ICD-10-CM | POA: Diagnosis not present

## 2022-03-28 NOTE — Progress Notes (Deleted)
Patient referred by Deland Pretty, MD for ***  Subjective:   Joshua Roberts, male    DOB: 02-21-1950, 72 y.o.   MRN: [id number]  *** No chief complaint on file.   *** HPI  72 y.o. *** male with ***  *** Past Medical History:  Diagnosis Date   Abnormal EKG    right bbb, left fasicular block since 2014   Chronic kidney disease    ckd stage 2   DM type 2 (diabetes mellitus, type 2) ([facility])    Elevated PSA    GERD (gastroesophageal reflux disease)    GI bleed 2015   Gout    History of kidney stones 2002 and 2006   Hypercholesteremia    Hypertension    Macular degeneration    Prostate cancer ([place])    Sleep apnea    does not have cpap yet    *** Past Surgical History:  Procedure Laterality Date   NASAL SINUS SURGERY     PROSTATE BIOPSY     RADIOACTIVE SEED IMPLANT N/A 05/21/2019   Procedure: RADIOACTIVE SEED IMPLANT/BRACHYTHERAPY IMPLANT;  Surgeon: Franchot Gallo, MD;  Location: [facility];  Service: Urology;  Laterality: N/A;  90 MINS   RIGHT COLECTOMY  05/07/2006   polyp removed   SPACE OAR INSTILLATION N/A 05/21/2019   Procedure: SPACE OAR INSTILLATION;  Surgeon: Franchot Gallo, MD;  Location: [facility];  Service: Urology;  Laterality: N/A;    *** Social History   Tobacco Use  Smoking Status Never   Passive exposure: Past  Smokeless Tobacco Never    Social History   Substance and Sexual Activity  Alcohol Use Not Currently    *** Family History  Problem Relation Age of Onset   Heart attack Mother    CVA Mother    Hypertension Father    Heart attack Brother     ***  Current Outpatient Medications:    allopurinol (ZYLOPRIM) 300 MG tablet, Take 300 mg by mouth at bedtime. , Disp: , Rfl:    amLODipine-olmesartan (AZOR) 10-40 MG tablet, Take 1 tablet by mouth at bedtime. , Disp: , Rfl:    aspirin 81 MG tablet, Take 81 mg by mouth daily., Disp: , Rfl:    BYSTOLIC 20 MG TABS, at bedtime. , Disp: , Rfl:     ferrous sulfate 325 (65 FE) MG tablet, TAKE ONE TABLET BY MOUTH DAILY PER DR ROLENS, Disp: , Rfl:    omeprazole (PRILOSEC) 20 MG capsule, Take 20 mg by mouth at bedtime. , Disp: , Rfl:    ONETOUCH VERIO test strip, AS DIRECTED TO CHECK BLOOD GLUCOSE TWICE DAILY. E11.65 IN VITRO 90 DAYS, Disp: , Rfl:    OZEMPIC, 0.25 OR 0.5 MG/DOSE, 2 MG/1.5ML SOPN, thursdays, Disp: , Rfl:    pravastatin (PRAVACHOL) 80 MG tablet, Take 80 mg by mouth at bedtime. , Disp: , Rfl:    sertraline (ZOLOFT) 50 MG tablet, Take 50 mg by mouth daily., Disp: , Rfl:    Cardiovascular and other pertinent studies:  Reviewed external labs and tests, independently interpreted  *** EKG ***/***/202***: ***  EKG 03/08/2022:  ***  *** Recent labs: 03/05/2022: Glucose 139, BUN/Cr 21/1.32. EGFR 63. Na/K 4.0/137. Rest of the CMP normal H/H 40.4/13.8. MCV 81.0. Platelets 268 ***HbA1C 6.6% Chol 127, TG 157, HDL 27, LDL 69 ***TSH ***normal   *** ROS      *** There were no vitals filed for this visit.   There is no height  or weight on file to calculate BMI. There were no vitals filed for this visit.  *** Objective:   Physical Exam    ***     Visit diagnoses: No diagnosis found.   No orders of the defined types were placed in this encounter.    Medication changes this visit: There are no discontinued medications.  No orders of the defined types were placed in this encounter.    Assessment & Recommendations:   ***  ***  Thank you for referring the patient to [place]. Please feel free to contact with any questions.   Nigel Mormon, MD Pager: [phone] Office: [phone]

## 2022-03-29 ENCOUNTER — Encounter: Payer: Federal, State, Local not specified - PPO | Admitting: Cardiology

## 2022-03-29 DIAGNOSIS — I2510 Atherosclerotic heart disease of native coronary artery without angina pectoris: Secondary | ICD-10-CM | POA: Insufficient documentation

## 2022-04-04 ENCOUNTER — Ambulatory Visit: Payer: Medicare Other | Admitting: Cardiology

## 2022-04-04 ENCOUNTER — Encounter: Payer: Self-pay | Admitting: Cardiology

## 2022-04-04 VITALS — BP 130/79 | HR 80 | Resp 16 | Ht 67.0 in | Wt 248.0 lb

## 2022-04-04 DIAGNOSIS — I2510 Atherosclerotic heart disease of native coronary artery without angina pectoris: Secondary | ICD-10-CM

## 2022-04-04 DIAGNOSIS — R9431 Abnormal electrocardiogram [ECG] [EKG]: Secondary | ICD-10-CM | POA: Insufficient documentation

## 2022-04-04 NOTE — Progress Notes (Signed)
Patient referred by Deland Pretty, MD for coronary artery disease  Subjective:   Joshua Roberts, male    DOB: [birth year], 72 y.o.   MRN: [id number]   Chief Complaint  Patient presents with   Coronary Artery Disease   New Patient (Initial Visit)     HPI  72 y.o. male with hypertension, diabetes mellitus, elevated coronary calcium score  Patient lives alone, retired for 10 years, used to work as a Chief Operating Officer He lives fairly sedentary lifestyle.  With this level of activity,he denies chest pain, shortness of breath, palpitations, leg edema, orthopnea, PND, TIA/syncope.  Recent coronary calcium score showed 92nd percentile of coronary calcium, details below.   Past Medical History:  Diagnosis Date   Abnormal EKG    right bbb, left fasicular block since 2014   Chronic kidney disease    ckd stage 2   DM type 2 (diabetes mellitus, type 2) (H[facility])    Elevated PSA    GERD (gastroesophageal reflux disease)    GI bleed 2015   Gout    History of kidney stones 2002 and 2006   Hypercholesteremia    Hypertension    Macular degeneration    Prostate cancer ([place])    Sleep apnea    does not have cpap yet     Past Surgical History:  Procedure Laterality Date   NASAL SINUS SURGERY     PROSTATE BIOPSY     RADIOACTIVE SEED IMPLANT N/A 05/21/2019   Procedure: RADIOACTIVE SEED IMPLANT/BRACHYTHERAPY IMPLANT;  Surgeon: Franchot Gallo, MD;  Location: [facility];  Service: Urology;  Laterality: N/A;  90 MINS   RIGHT COLECTOMY  05/07/2006   polyp removed   SPACE OAR INSTILLATION N/A 05/21/2019   Procedure: SPACE OAR INSTILLATION;  Surgeon: Franchot Gallo, MD;  [facility];  Service: Urology;  Laterality: N/A;     Social History   Tobacco Use  Smoking Status Never   Passive exposure: Past  Smokeless Tobacco Never    Social History   Substance and Sexual Activity  Alcohol Use Not Currently     Family History  Problem Relation Age  of Onset   Heart attack Mother    CVA Mother    Hypertension Father    Heart attack Brother       Current Outpatient Medications:    allopurinol (ZYLOPRIM) 300 MG tablet, Take 300 mg by mouth at bedtime. , Disp: , Rfl:    amLODipine-olmesartan (AZOR) 10-40 MG tablet, Take 1 tablet by mouth at bedtime. , Disp: , Rfl:    aspirin 81 MG tablet, Take 81 mg by mouth daily., Disp: , Rfl:    BYSTOLIC 20 MG TABS, at bedtime. , Disp: , Rfl:    FARXIGA 10 MG TABS tablet, Take 10 mg by mouth daily., Disp: , Rfl:    ferrous sulfate 325 (65 FE) MG tablet, TAKE ONE TABLET BY MOUTH DAILY PER DR ROLENS, Disp: , Rfl:    omeprazole (PRILOSEC) 20 MG capsule, Take 20 mg by mouth at bedtime. , Disp: , Rfl:    ONETOUCH VERIO test strip, AS DIRECTED TO CHECK BLOOD GLUCOSE TWICE DAILY. E11.65 IN VITRO 90 DAYS, Disp: , Rfl:    OZEMPIC, 0.25 OR 0.5 MG/DOSE, 2 MG/1.5ML SOPN, thursdays, Disp: , Rfl:    pravastatin (PRAVACHOL) 80 MG tablet, Take 80 mg by mouth at bedtime. , Disp: , Rfl:    sertraline (ZOLOFT) 50 MG tablet, Take 50 mg by mouth daily., Disp: ,  Rfl:    Cardiovascular and other pertinent studies:  Reviewed external labs and tests, independently interpreted  EKG 04/04/2022: Sinus rhythm 77 bpm RBBB LAFB  CT cardiac scoring 04/03/2022: 1. Coronary calcium score of 1,495 is at the 92nd percentile for the patient's age, sex and race. 2. Mild aneurysmal dilatation of the ascending thoracic aorta measuring up to approximately 4.1 cm in estimated maximal caliber.  LM: 0 LAD: 634 LCx: 593 RCA: 268   Total Agatston Score: 1,495 MESA database percentile: 92    Recent labs: 03/05/2022: Glucose 139, BUN/Cr 21/1.32. EGFR 63. Na/K 4.0/137. Rest of the CMP normal H/H 40.4/13.8. MCV 81.0. Platelets 268 HbA1C 6.6% Chol 127, TG 157, HDL 27, LDL 69   Review of Systems  Cardiovascular:  Negative for chest pain, dyspnea on exertion, leg swelling, palpitations and syncope.         Vitals:    04/04/22 1248  BP: 130/79  Pulse: 80  Resp: 16  SpO2: 96%     Body mass index is 38.84 kg/m. Filed Weights   04/04/22 1248  Weight: 248 lb (112.5 kg)     Objective:   Physical Exam Vitals and nursing note reviewed.  Constitutional:      General: He is not in acute distress. Neck:     Vascular: No JVD.  Cardiovascular:     Rate and Rhythm: Normal rate and regular rhythm.     Heart sounds: Normal heart sounds. No murmur heard. Pulmonary:     Effort: Pulmonary effort is normal.     Breath sounds: Normal breath sounds. No wheezing or rales.  Musculoskeletal:     Right lower leg: No edema.     Left lower leg: No edema.          Visit diagnoses:   ICD-10-CM   1. Coronary artery disease involving native coronary artery of native heart without angina pectoris  I25.10 EKG 12-Lead    Lipoprotein A (LPA)    PCV ECHOCARDIOGRAM COMPLETE    PCV MYOCARDIAL PERFUSION WO LEXISCAN    2. Abnormal electrocardiogram  R94.31 PCV MYOCARDIAL PERFUSION WO LEXISCAN       Orders Placed This Encounter  Procedures   Lipoprotein A (LPA)   PCV MYOCARDIAL PERFUSION WO LEXISCAN   EKG 12-Lead   PCV ECHOCARDIOGRAM COMPLETE      Assessment & Recommendations:    72 y.o. male with hypertension, diabetes mellitus, elevated coronary calcium score  Elevated coronary calcium score: Coronary artery disease without any angina pectoris. Resting EKG with RBBB, LAFB. Check lipoprotein a given his unusually high amount of coronary calcium in spite of fairly controlled diabetes and hyperlipidemia. Recommend exercise nuclear stress test, echocardiogram.  Further recommendations after above testing.   Thank you for referring the patient to [place]. Please feel free to contact with any questions.   Nigel Mormon, MD Pager: [phone] Office: [phone]

## 2022-04-06 ENCOUNTER — Ambulatory Visit: Payer: Medicare Other

## 2022-04-06 DIAGNOSIS — I2510 Atherosclerotic heart disease of native coronary artery without angina pectoris: Secondary | ICD-10-CM | POA: Diagnosis not present

## 2022-04-10 LAB — LIPOPROTEIN A (LPA): Lipoprotein (a): 8.9 nmol/L (ref <75.0)

## 2022-04-30 ENCOUNTER — Other Ambulatory Visit: Payer: Federal, State, Local not specified - PPO

## 2022-05-14 ENCOUNTER — Ambulatory Visit: Payer: Federal, State, Local not specified - PPO | Admitting: Cardiology

## 2022-05-17 ENCOUNTER — Ambulatory Visit: Payer: Medicare Other

## 2022-05-17 DIAGNOSIS — R9431 Abnormal electrocardiogram [ECG] [EKG]: Secondary | ICD-10-CM | POA: Diagnosis not present

## 2022-05-17 DIAGNOSIS — I2510 Atherosclerotic heart disease of native coronary artery without angina pectoris: Secondary | ICD-10-CM

## 2022-05-21 LAB — PCV MYOCARDIAL PERFUSION WO LEXISCAN: ST Depression (mm): 0 mm

## 2022-05-21 NOTE — Progress Notes (Signed)
Patient referred by Merri Brunette, MD for coronary artery disease  Subjective:   Joshua Roberts, male    DOB: Mar 17, 1950, 72 y.o.   MRN: [id number]   Chief Complaint  Patient presents with   Coronary Artery Disease   Follow-up     HPI  72 y.o. male with hypertension, diabetes mellitus, elevated coronary calcium score  Patient denies chest pain, shortness of breath, palpitations, leg edema, orthopnea, PND, TIA/syncope.  Initial consultation visit 03/2022: Patient lives alone, retired for 10 years, used to work as a Leisure centre manager He lives fairly sedentary lifestyle.  With this level of activity,he denies chest pain, shortness of breath, palpitations, leg edema, orthopnea, PND, TIA/syncope.  Recent coronary calcium score showed 92nd percentile of coronary calcium, details below.     Current Outpatient Medications:    allopurinol (ZYLOPRIM) 300 MG tablet, Take 300 mg by mouth at bedtime. , Disp: , Rfl:    amLODipine-olmesartan (AZOR) 10-40 MG tablet, Take 1 tablet by mouth at bedtime. , Disp: , Rfl:    aspirin 81 MG tablet, Take 81 mg by mouth daily., Disp: , Rfl:    BYSTOLIC 20 MG TABS, at bedtime. , Disp: , Rfl:    FARXIGA 10 MG TABS tablet, Take 10 mg by mouth daily., Disp: , Rfl:    ferrous sulfate 325 (65 FE) MG tablet, TAKE ONE TABLET BY MOUTH DAILY PER DR ROLENS, Disp: , Rfl:    omeprazole (PRILOSEC) 20 MG capsule, Take 20 mg by mouth at bedtime. , Disp: , Rfl:    ONETOUCH VERIO test strip, AS DIRECTED TO CHECK BLOOD GLUCOSE TWICE DAILY. E11.65 IN VITRO 90 DAYS, Disp: , Rfl:    OZEMPIC, 0.25 OR 0.5 MG/DOSE, 2 MG/1.5ML SOPN, thursdays, Disp: , Rfl:    pravastatin (PRAVACHOL) 80 MG tablet, Take 80 mg by mouth at bedtime. , Disp: , Rfl:    sertraline (ZOLOFT) 50 MG tablet, Take 50 mg by mouth daily., Disp: , Rfl:    Cardiovascular and other pertinent studies:  Reviewed external labs and tests, independently interpreted  Regadenoson (with Mod Bruce protocol) Nuclear stress  test 05/17/2022: Test converted to walking lexi during exercise due to fatigue/unable to achieve max heart rate.  Myocardial perfusion is abnormal. There is a reversible mild defect in the apical region.  Overall LV systolic function is normal without regional wall motion abnormalities. Stress LV EF: 63%.  Nondiagnostic ECG stress. The heart rate response was consistent with Regadenoson.  No previous exam available for comparison. Low risk.   Echocardiogram 04/06/2022:  Normal LV systolic function with visual EF 60-65%. Left ventricle cavity  is normal in size. Normal global wall motion. Normal diastolic filling  pattern, normal LAP. Moderate left ventricular hypertrophy.  No significant valvular abnormalities.  No prior study for comparison.   EKG 04/04/2022: Sinus rhythm 77 bpm RBBB LAFB  CT cardiac scoring 04/03/2022: 1. Coronary calcium score of 1,495 is at the 92nd percentile for the patient's age, sex and race. 2. Mild aneurysmal dilatation of the ascending thoracic aorta measuring up to approximately 4.1 cm in estimated maximal caliber.  LM: 0 LAD: 634 LCx: 593 RCA: 268   Total Agatston Score: 1,495 MESA database percentile: 92    Recent labs: 03/05/2022: Glucose 139, BUN/Cr 21/1.32. EGFR 63. Na/K 4.0/137. Rest of the CMP normal H/H 40.4/13.8. MCV 81.0. Platelets 268 HbA1C 6.6% Chol 127, TG 157, HDL 27, LDL 69 Lipoprotein (a) 8.9 normal   Review of Systems  Cardiovascular:  Negative for chest  pain, dyspnea on exertion, leg swelling, palpitations and syncope.         Vitals:   05/28/22 1108  BP: (!) 143/76  Pulse: 72  Resp: 16  SpO2: 97%     Body mass index is 38.37 kg/m. Filed Weights   05/28/22 1108  Weight: 245 lb (111.1 kg)     Objective:   Physical Exam Vitals and nursing note reviewed.  Constitutional:      General: He is not in acute distress. Neck:     Vascular: No JVD.  Cardiovascular:     Rate and Rhythm: Normal rate and regular  rhythm.     Heart sounds: Normal heart sounds. No murmur heard. Pulmonary:     Effort: Pulmonary effort is normal.     Breath sounds: Normal breath sounds. No wheezing or rales.  Musculoskeletal:     Right lower leg: No edema.     Left lower leg: No edema.          Visit diagnoses:   ICD-10-CM   1. Coronary artery disease involving native coronary artery of native heart without angina pectoris  I25.10     2. Essential hypertension  I10         Assessment & Recommendations:    72 y.o. male with hypertension, diabetes mellitus, elevated coronary calcium score  Elevated coronary calcium score: Coronary artery disease without any angina pectoris. Resting EKG with RBBB, LAFB. No ischemia on stress testing 05/2022. Chol 127, TG 157, HDL 27, LDL 69 (1.2024) Lipoprotein (a) 8.9 normal  Continue Aspirin 81 mg daily, in absence of any bleeding issues. Change pravastatin to Crestor 20 mg daily. He will get lipid panel with his PCP in next few weeks.  F/u in 1 year    Elder Negus, MD Pager: [phone] Office: [phone]

## 2022-05-23 DIAGNOSIS — Z8546 Personal history of malignant neoplasm of prostate: Secondary | ICD-10-CM | POA: Diagnosis not present

## 2022-05-28 ENCOUNTER — Ambulatory Visit: Payer: Medicare Other | Admitting: Cardiology

## 2022-05-28 ENCOUNTER — Encounter: Payer: Self-pay | Admitting: Cardiology

## 2022-05-28 VITALS — BP 143/76 | HR 72 | Resp 16 | Ht 67.0 in | Wt 245.0 lb

## 2022-05-28 DIAGNOSIS — I2510 Atherosclerotic heart disease of native coronary artery without angina pectoris: Secondary | ICD-10-CM

## 2022-05-28 DIAGNOSIS — I10 Essential (primary) hypertension: Secondary | ICD-10-CM | POA: Diagnosis not present

## 2022-05-28 MED ORDER — ROSUVASTATIN CALCIUM 20 MG PO TABS: 20.0000 mg | ORAL_TABLET | Freq: Every day | ORAL | 3 refills | Status: DC

## 2022-05-30 DIAGNOSIS — I10 Essential (primary) hypertension: Secondary | ICD-10-CM | POA: Diagnosis not present

## 2022-05-30 DIAGNOSIS — R3915 Urgency of urination: Secondary | ICD-10-CM | POA: Diagnosis not present

## 2022-05-30 DIAGNOSIS — R351 Nocturia: Secondary | ICD-10-CM | POA: Diagnosis not present

## 2022-05-30 DIAGNOSIS — E782 Mixed hyperlipidemia: Secondary | ICD-10-CM | POA: Diagnosis not present

## 2022-05-30 DIAGNOSIS — E1121 Type 2 diabetes mellitus with diabetic nephropathy: Secondary | ICD-10-CM | POA: Diagnosis not present

## 2022-06-07 DIAGNOSIS — I2510 Atherosclerotic heart disease of native coronary artery without angina pectoris: Secondary | ICD-10-CM | POA: Diagnosis not present

## 2022-06-07 DIAGNOSIS — N1831 Chronic kidney disease, stage 3a: Secondary | ICD-10-CM | POA: Diagnosis not present

## 2022-06-07 DIAGNOSIS — E1121 Type 2 diabetes mellitus with diabetic nephropathy: Secondary | ICD-10-CM | POA: Diagnosis not present

## 2022-06-07 DIAGNOSIS — I10 Essential (primary) hypertension: Secondary | ICD-10-CM | POA: Diagnosis not present

## 2022-06-07 DIAGNOSIS — E782 Mixed hyperlipidemia: Secondary | ICD-10-CM | POA: Diagnosis not present

## 2022-07-18 DIAGNOSIS — H40013 Open angle with borderline findings, low risk, bilateral: Secondary | ICD-10-CM | POA: Diagnosis not present

## 2022-07-18 DIAGNOSIS — H35033 Hypertensive retinopathy, bilateral: Secondary | ICD-10-CM | POA: Diagnosis not present

## 2022-08-23 DIAGNOSIS — M109 Gout, unspecified: Secondary | ICD-10-CM | POA: Diagnosis not present

## 2022-08-23 DIAGNOSIS — N1831 Chronic kidney disease, stage 3a: Secondary | ICD-10-CM | POA: Diagnosis not present

## 2022-08-23 DIAGNOSIS — I129 Hypertensive chronic kidney disease with stage 1 through stage 4 chronic kidney disease, or unspecified chronic kidney disease: Secondary | ICD-10-CM | POA: Diagnosis not present

## 2022-08-23 DIAGNOSIS — E1122 Type 2 diabetes mellitus with diabetic chronic kidney disease: Secondary | ICD-10-CM | POA: Diagnosis not present

## 2022-08-23 DIAGNOSIS — D631 Anemia in chronic kidney disease: Secondary | ICD-10-CM | POA: Diagnosis not present

## 2022-08-23 DIAGNOSIS — N2581 Secondary hyperparathyroidism of renal origin: Secondary | ICD-10-CM | POA: Diagnosis not present

## 2022-09-11 DIAGNOSIS — I10 Essential (primary) hypertension: Secondary | ICD-10-CM | POA: Diagnosis not present

## 2022-09-11 DIAGNOSIS — E1121 Type 2 diabetes mellitus with diabetic nephropathy: Secondary | ICD-10-CM | POA: Diagnosis not present

## 2022-09-11 DIAGNOSIS — N1831 Chronic kidney disease, stage 3a: Secondary | ICD-10-CM | POA: Diagnosis not present

## 2022-09-11 DIAGNOSIS — E782 Mixed hyperlipidemia: Secondary | ICD-10-CM | POA: Diagnosis not present

## 2022-09-11 DIAGNOSIS — I2510 Atherosclerotic heart disease of native coronary artery without angina pectoris: Secondary | ICD-10-CM | POA: Diagnosis not present

## 2022-10-09 DIAGNOSIS — N1831 Chronic kidney disease, stage 3a: Secondary | ICD-10-CM | POA: Diagnosis not present

## 2022-10-09 DIAGNOSIS — E11319 Type 2 diabetes mellitus with unspecified diabetic retinopathy without macular edema: Secondary | ICD-10-CM | POA: Diagnosis not present

## 2022-10-09 DIAGNOSIS — E782 Mixed hyperlipidemia: Secondary | ICD-10-CM | POA: Diagnosis not present

## 2022-10-09 DIAGNOSIS — I2510 Atherosclerotic heart disease of native coronary artery without angina pectoris: Secondary | ICD-10-CM | POA: Diagnosis not present

## 2022-10-09 DIAGNOSIS — I10 Essential (primary) hypertension: Secondary | ICD-10-CM | POA: Diagnosis not present

## 2022-10-09 DIAGNOSIS — E1121 Type 2 diabetes mellitus with diabetic nephropathy: Secondary | ICD-10-CM | POA: Diagnosis not present

## 2022-10-11 NOTE — Progress Notes (Signed)
This encounter was created in error - please disregard.

## 2022-11-01 IMAGING — CT CT CARDIAC CORONARY ARTERY CALCIUM SCORE
3 series · 14 of 20 positions shown, 16 images · non-contrast
Comparison: None.

CLINICAL DATA: 70-year-old Caucasian male with history of
hyperlipidemia, hypertension, diabetes and family history of heart
disease.

EXAM:
CT CARDIAC CORONARY ARTERY CALCIUM SCORE
TECHNIQUE: Non-contrast imaging through the heart was performed using
prospective ECG gating. Image post processing was performed on an
independent workstation, allowing for quantitative analysis of the
heart and coronary arteries. Note that this exam targets the heart
and the chest was not imaged in its entirety.

[Series 2: calcium scoring 2.00 qr36 bestdiast 71% hrt calciu · axial · 0.42mm/px · z∈[+1658,+1748]mm · 4 of 75 slices shown]
[im 15/75  vessel]
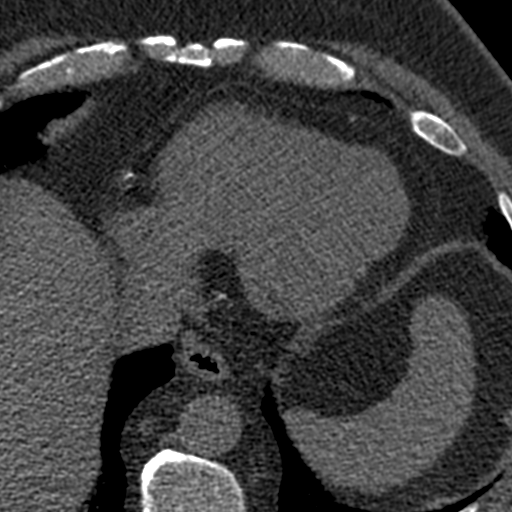
[im 30/75  vessel]
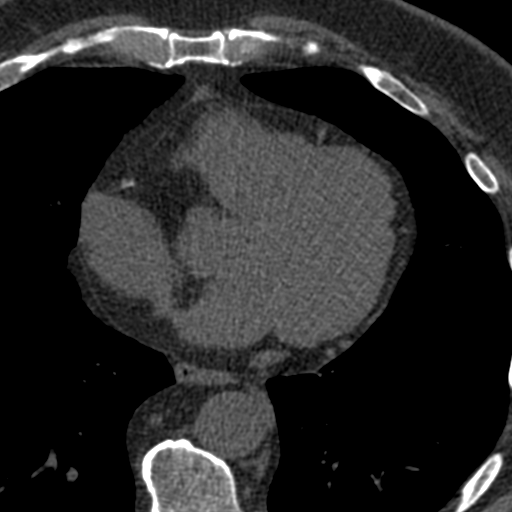
[im 45/75  vessel]
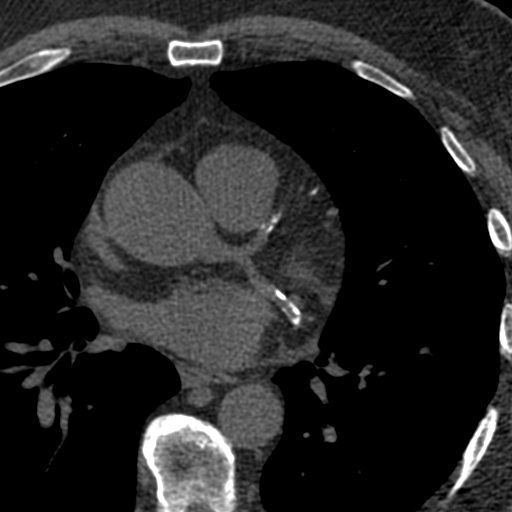
[im 60/75  vessel]
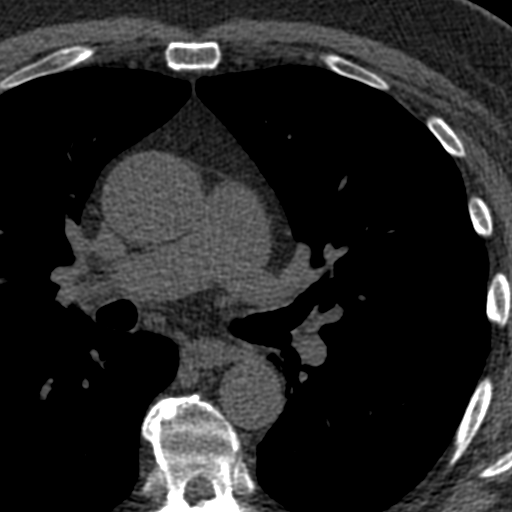

[Series 3: calcium scoring 2.00 br40 bestdiast 71% axial · axial · 0.76mm/px · z∈[+1648,+1752]mm · 5 of 78 slices shown, 7 images]
[im 13/78  vessel]
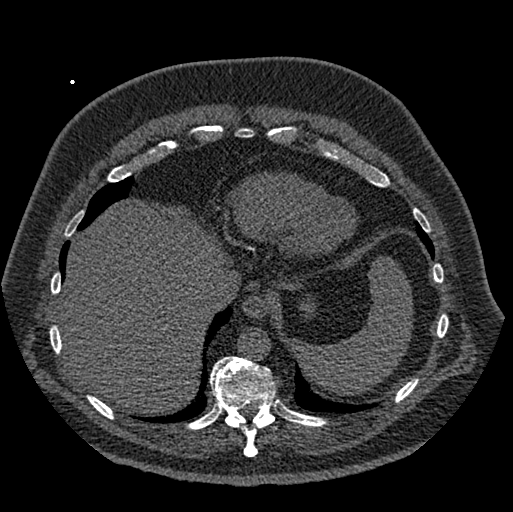
[im 13/78  lung]
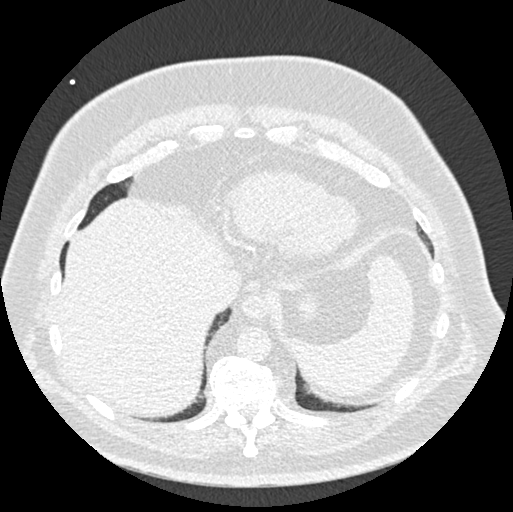
[im 26/78  vessel]
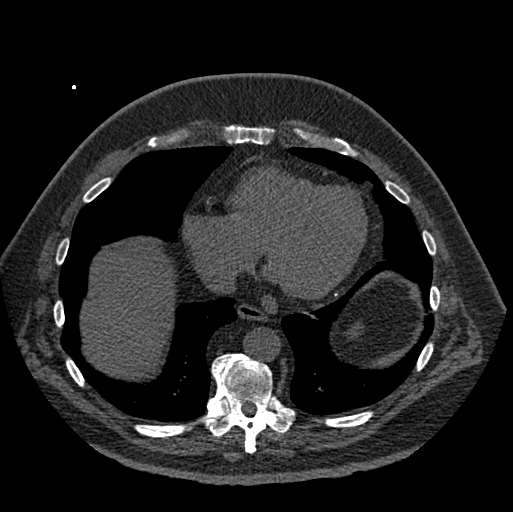
[im 39/78  vessel]
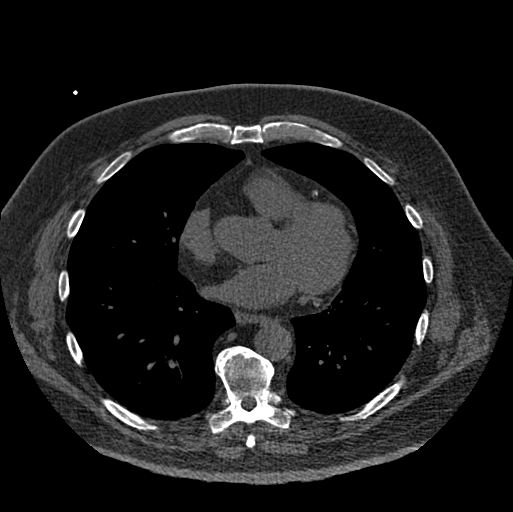
[im 52/78  vessel]
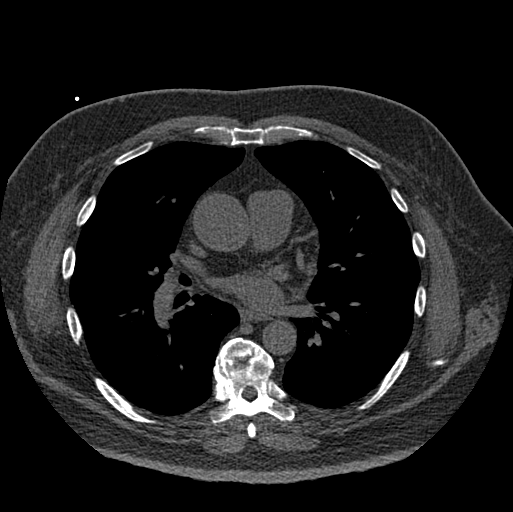
[im 65/78  vessel]
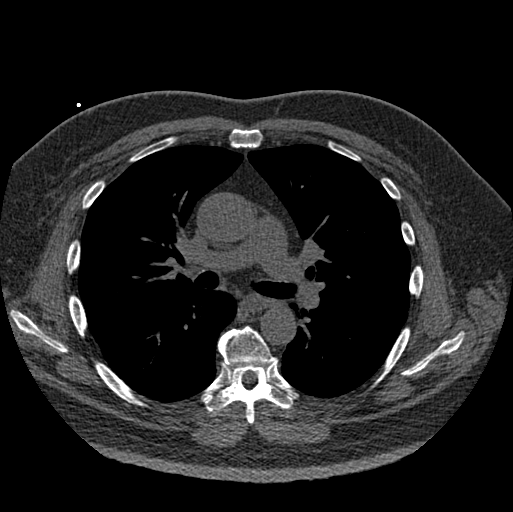
[im 65/78  lung]
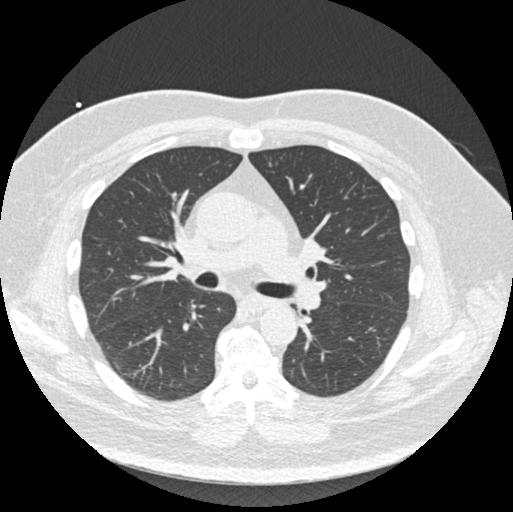

[Series 9: calcium scoring 2.00 br60 bestdiast 71% lungs · axial · 0.76mm/px · z∈[+1648,+1752]mm · 5 of 78 slices shown]
[im 13/78  vessel]
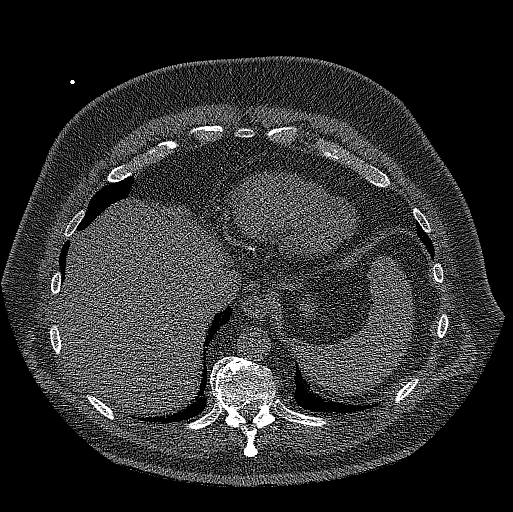
[im 26/78  vessel]
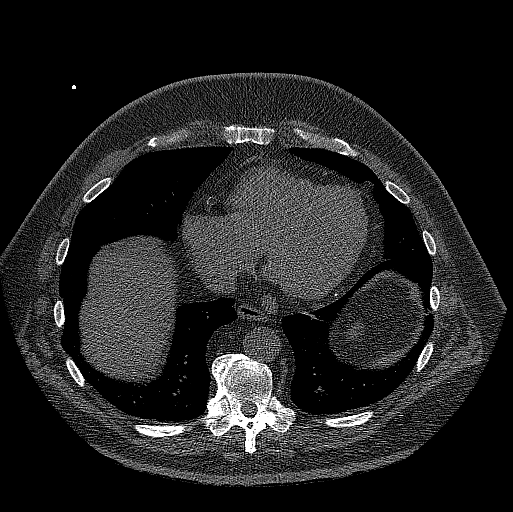
[im 39/78  vessel]
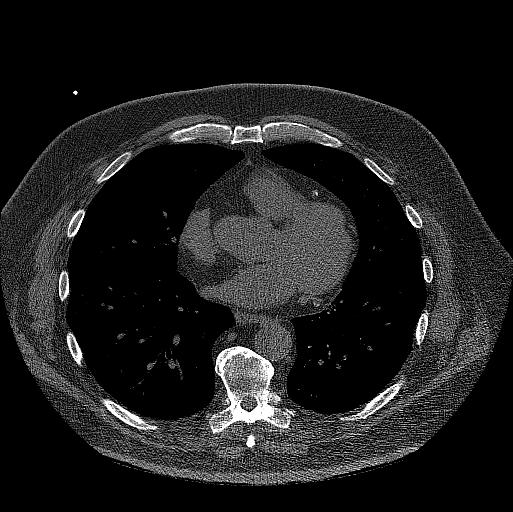
[im 52/78  vessel]
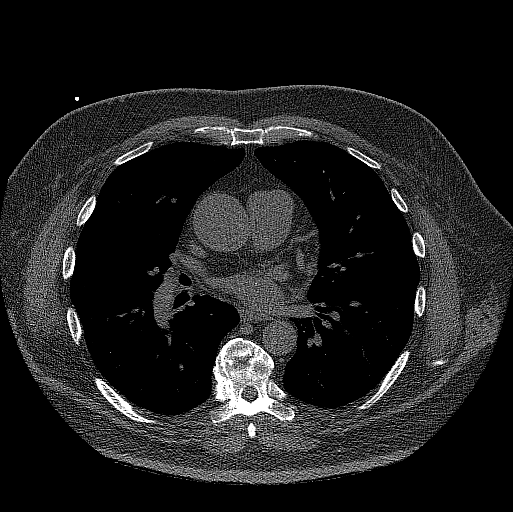
[im 65/78  vessel]
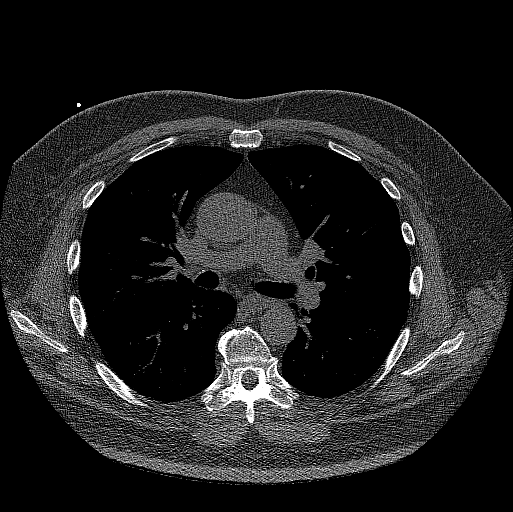

[14 of 20 positions shown; findings below may reference images not displayed]

FINDINGS: CORONARY CALCIUM SCORES:

Left Main: 0

LAD: 634

LCx: 593

RCA: 268

Total Agatston Score: 1,495

[HOSPITAL] percentile: 92

AORTA MEASUREMENTS:

Ascending Aorta: 41 mm

Descending Aorta: 26 mm

OTHER FINDINGS:

The heart size is within normal limits. No pericardial fluid is
identified. The ascending thoracic aorta demonstrates mild
aneurysmal dilatation with maximal estimated diameter of 4.1 cm by
unenhanced CT. Visualized central pulmonary arteries are normal in
caliber. Visualized mediastinum and hilar regions demonstrate no
lymphadenopathy or masses. Visualized lungs show no evidence of
pulmonary edema, consolidation, pneumothorax, nodule or pleural
fluid. Visualized upper abdomen and bony structures are
unremarkable.
IMPRESSION: 1. Coronary calcium score of 1,495 is at the 92nd percentile for the
patient's age, sex and race.
2. Mild aneurysmal dilatation of the ascending thoracic aorta
measuring up to approximately 4.1 cm in estimated maximal caliber.
Recommend annual imaging followup by CTA or MRA. This recommendation
follows 5616 ACCF/AHA/AATS/ACR/ASA/SCA/NATA-LI/QUINTIN/SYAABAN/LIPEZ Guidelines
for the Diagnosis and Management of Patients with Thoracic Aortic
Disease. Circulation. 5616; 121: E266-e369. Aortic aneurysm NOS
([id number])

## 2023-01-08 DIAGNOSIS — E1121 Type 2 diabetes mellitus with diabetic nephropathy: Secondary | ICD-10-CM | POA: Diagnosis not present

## 2023-01-08 DIAGNOSIS — E11319 Type 2 diabetes mellitus with unspecified diabetic retinopathy without macular edema: Secondary | ICD-10-CM | POA: Diagnosis not present

## 2023-01-08 DIAGNOSIS — I2510 Atherosclerotic heart disease of native coronary artery without angina pectoris: Secondary | ICD-10-CM | POA: Diagnosis not present

## 2023-01-08 DIAGNOSIS — I10 Essential (primary) hypertension: Secondary | ICD-10-CM | POA: Diagnosis not present

## 2023-01-08 DIAGNOSIS — N1831 Chronic kidney disease, stage 3a: Secondary | ICD-10-CM | POA: Diagnosis not present

## 2023-01-08 DIAGNOSIS — E782 Mixed hyperlipidemia: Secondary | ICD-10-CM | POA: Diagnosis not present

## 2023-01-15 DIAGNOSIS — E119 Type 2 diabetes mellitus without complications: Secondary | ICD-10-CM | POA: Diagnosis not present

## 2023-01-15 DIAGNOSIS — H35033 Hypertensive retinopathy, bilateral: Secondary | ICD-10-CM | POA: Diagnosis not present

## 2023-01-15 DIAGNOSIS — H348322 Tributary (branch) retinal vein occlusion, left eye, stable: Secondary | ICD-10-CM | POA: Diagnosis not present

## 2023-01-15 DIAGNOSIS — H40013 Open angle with borderline findings, low risk, bilateral: Secondary | ICD-10-CM | POA: Diagnosis not present

## 2023-02-14 ENCOUNTER — Encounter (INDEPENDENT_AMBULATORY_CARE_PROVIDER_SITE_OTHER): Payer: Medicare Other | Admitting: Ophthalmology

## 2023-02-14 DIAGNOSIS — I10 Essential (primary) hypertension: Secondary | ICD-10-CM

## 2023-02-14 DIAGNOSIS — H33302 Unspecified retinal break, left eye: Secondary | ICD-10-CM

## 2023-02-14 DIAGNOSIS — H35033 Hypertensive retinopathy, bilateral: Secondary | ICD-10-CM | POA: Diagnosis not present

## 2023-02-14 DIAGNOSIS — H43813 Vitreous degeneration, bilateral: Secondary | ICD-10-CM

## 2023-02-14 DIAGNOSIS — H348322 Tributary (branch) retinal vein occlusion, left eye, stable: Secondary | ICD-10-CM

## 2023-03-02 ENCOUNTER — Other Ambulatory Visit: Payer: Self-pay | Admitting: Cardiology

## 2023-03-11 DIAGNOSIS — E782 Mixed hyperlipidemia: Secondary | ICD-10-CM | POA: Diagnosis not present

## 2023-03-11 DIAGNOSIS — I10 Essential (primary) hypertension: Secondary | ICD-10-CM | POA: Diagnosis not present

## 2023-03-11 DIAGNOSIS — Z125 Encounter for screening for malignant neoplasm of prostate: Secondary | ICD-10-CM | POA: Diagnosis not present

## 2023-03-13 DIAGNOSIS — C61 Malignant neoplasm of prostate: Secondary | ICD-10-CM | POA: Diagnosis not present

## 2023-03-13 DIAGNOSIS — Z23 Encounter for immunization: Secondary | ICD-10-CM | POA: Diagnosis not present

## 2023-03-13 DIAGNOSIS — E1121 Type 2 diabetes mellitus with diabetic nephropathy: Secondary | ICD-10-CM | POA: Diagnosis not present

## 2023-03-13 DIAGNOSIS — I7121 Aneurysm of the ascending aorta, without rupture: Secondary | ICD-10-CM | POA: Diagnosis not present

## 2023-03-13 DIAGNOSIS — I2510 Atherosclerotic heart disease of native coronary artery without angina pectoris: Secondary | ICD-10-CM | POA: Diagnosis not present

## 2023-03-13 DIAGNOSIS — Z0000 Encounter for general adult medical examination without abnormal findings: Secondary | ICD-10-CM | POA: Diagnosis not present

## 2023-03-13 DIAGNOSIS — I10 Essential (primary) hypertension: Secondary | ICD-10-CM | POA: Diagnosis not present

## 2023-03-13 DIAGNOSIS — K219 Gastro-esophageal reflux disease without esophagitis: Secondary | ICD-10-CM | POA: Diagnosis not present

## 2023-03-13 DIAGNOSIS — M109 Gout, unspecified: Secondary | ICD-10-CM | POA: Diagnosis not present

## 2023-03-13 DIAGNOSIS — E538 Deficiency of other specified B group vitamins: Secondary | ICD-10-CM | POA: Diagnosis not present

## 2023-04-25 DIAGNOSIS — C61 Malignant neoplasm of prostate: Secondary | ICD-10-CM | POA: Diagnosis not present

## 2023-04-25 DIAGNOSIS — M109 Gout, unspecified: Secondary | ICD-10-CM | POA: Diagnosis not present

## 2023-04-25 DIAGNOSIS — E1122 Type 2 diabetes mellitus with diabetic chronic kidney disease: Secondary | ICD-10-CM | POA: Diagnosis not present

## 2023-04-25 DIAGNOSIS — N1831 Chronic kidney disease, stage 3a: Secondary | ICD-10-CM | POA: Diagnosis not present

## 2023-04-25 DIAGNOSIS — D631 Anemia in chronic kidney disease: Secondary | ICD-10-CM | POA: Diagnosis not present

## 2023-04-25 DIAGNOSIS — N2581 Secondary hyperparathyroidism of renal origin: Secondary | ICD-10-CM | POA: Diagnosis not present

## 2023-04-25 DIAGNOSIS — I129 Hypertensive chronic kidney disease with stage 1 through stage 4 chronic kidney disease, or unspecified chronic kidney disease: Secondary | ICD-10-CM | POA: Diagnosis not present

## 2023-05-29 ENCOUNTER — Ambulatory Visit: Payer: Self-pay | Admitting: Cardiology

## 2023-06-05 ENCOUNTER — Other Ambulatory Visit: Payer: Self-pay | Admitting: Cardiology

## 2023-06-18 DIAGNOSIS — E11319 Type 2 diabetes mellitus with unspecified diabetic retinopathy without macular edema: Secondary | ICD-10-CM | POA: Diagnosis not present

## 2023-06-18 DIAGNOSIS — N1831 Chronic kidney disease, stage 3a: Secondary | ICD-10-CM | POA: Diagnosis not present

## 2023-06-18 DIAGNOSIS — E1121 Type 2 diabetes mellitus with diabetic nephropathy: Secondary | ICD-10-CM | POA: Diagnosis not present

## 2023-06-18 DIAGNOSIS — I2510 Atherosclerotic heart disease of native coronary artery without angina pectoris: Secondary | ICD-10-CM | POA: Diagnosis not present

## 2023-06-18 DIAGNOSIS — I10 Essential (primary) hypertension: Secondary | ICD-10-CM | POA: Diagnosis not present

## 2023-06-18 DIAGNOSIS — E782 Mixed hyperlipidemia: Secondary | ICD-10-CM | POA: Diagnosis not present

## 2023-06-29 ENCOUNTER — Other Ambulatory Visit: Payer: Self-pay | Admitting: Cardiology

## 2023-07-03 ENCOUNTER — Other Ambulatory Visit: Payer: Self-pay | Admitting: Cardiology

## 2023-07-11 ENCOUNTER — Other Ambulatory Visit: Payer: Self-pay | Admitting: Cardiology

## 2023-08-15 DIAGNOSIS — H40013 Open angle with borderline findings, low risk, bilateral: Secondary | ICD-10-CM | POA: Diagnosis not present

## 2023-09-09 ENCOUNTER — Other Ambulatory Visit: Payer: Self-pay | Admitting: Cardiology

## 2023-10-03 DIAGNOSIS — I2510 Atherosclerotic heart disease of native coronary artery without angina pectoris: Secondary | ICD-10-CM | POA: Diagnosis not present

## 2023-10-03 DIAGNOSIS — E782 Mixed hyperlipidemia: Secondary | ICD-10-CM | POA: Diagnosis not present

## 2023-10-03 DIAGNOSIS — E1121 Type 2 diabetes mellitus with diabetic nephropathy: Secondary | ICD-10-CM | POA: Diagnosis not present

## 2023-10-03 DIAGNOSIS — I10 Essential (primary) hypertension: Secondary | ICD-10-CM | POA: Diagnosis not present

## 2024-02-13 ENCOUNTER — Encounter (INDEPENDENT_AMBULATORY_CARE_PROVIDER_SITE_OTHER): Payer: Medicare Other | Admitting: Ophthalmology
